# Patient Record
Sex: Male | Born: 1990 | Race: Black or African American | Hispanic: No | Marital: Single | Smoking: Current every day smoker
Health system: Southern US, Community
[De-identification: ages and names within clinical notes are randomized; demographics above are authoritative.]

---
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
## 2010-06-14 ENCOUNTER — Emergency Department (HOSPITAL_COMMUNITY)
Admission: EM | Admit: 2010-06-14 | Discharge: 2010-06-14 | Payer: Self-pay | Source: Home / Self Care | Admitting: Emergency Medicine

## 2010-06-15 ENCOUNTER — Emergency Department (HOSPITAL_COMMUNITY)
Admission: EM | Admit: 2010-06-15 | Discharge: 2010-06-15 | Payer: Self-pay | Source: Home / Self Care | Admitting: Emergency Medicine

## 2010-07-03 ENCOUNTER — Emergency Department (HOSPITAL_COMMUNITY)
Admission: EM | Admit: 2010-07-03 | Discharge: 2010-07-03 | Payer: Self-pay | Source: Home / Self Care | Admitting: Emergency Medicine

## 2010-07-03 LAB — GLUCOSE, CAPILLARY: Glucose-Capillary: 99 mg/dL (ref 70–99)

## 2010-09-08 LAB — CBC
HCT: 47.2 % (ref 39.0–52.0)
Hemoglobin: 16.6 g/dL (ref 13.0–17.0)
MCH: 31.5 pg (ref 26.0–34.0)
MCHC: 35.2 g/dL (ref 30.0–36.0)
MCV: 89.6 fL (ref 78.0–100.0)
Platelets: 266 10*3/uL (ref 150–400)
RBC: 5.27 MIL/uL (ref 4.22–5.81)
RDW: 13.8 % (ref 11.5–15.5)
WBC: 11.6 10*3/uL — ABNORMAL HIGH (ref 4.0–10.5)

## 2010-09-08 LAB — DIFFERENTIAL
Basophils Absolute: 0 10*3/uL (ref 0.0–0.1)
Basophils Relative: 0 % (ref 0–1)
Eosinophils Absolute: 0 10*3/uL (ref 0.0–0.7)
Eosinophils Relative: 0 % (ref 0–5)
Lymphocytes Relative: 14 % (ref 12–46)
Lymphs Abs: 1.6 10*3/uL (ref 0.7–4.0)
Monocytes Absolute: 0.9 10*3/uL (ref 0.1–1.0)
Monocytes Relative: 8 % (ref 3–12)
Neutro Abs: 9.1 10*3/uL — ABNORMAL HIGH (ref 1.7–7.7)
Neutrophils Relative %: 78 % — ABNORMAL HIGH (ref 43–77)

## 2010-09-08 LAB — COMPREHENSIVE METABOLIC PANEL
ALT: 25 U/L (ref 0–53)
AST: 37 U/L (ref 0–37)
Albumin: 4.5 g/dL (ref 3.5–5.2)
Alkaline Phosphatase: 86 U/L (ref 39–117)
BUN: 13 mg/dL (ref 6–23)
CO2: 24 mEq/L (ref 19–32)
Calcium: 9.7 mg/dL (ref 8.4–10.5)
Chloride: 100 mEq/L (ref 96–112)
Creatinine, Ser: 1.13 mg/dL (ref 0.4–1.5)
GFR calc Af Amer: >60 mL/min (ref >60)
GFR calc non Af Amer: >60 mL/min (ref >60)
Glucose, Bld: 98 mg/dL (ref 70–99)
Potassium: 3 mEq/L — ABNORMAL LOW (ref 3.5–5.1)
Sodium: 135 mEq/L (ref 135–145)
Total Bilirubin: 0.6 mg/dL (ref 0.3–1.2)
Total Protein: 8.1 g/dL (ref 6.0–8.3)

## 2010-09-08 LAB — URINALYSIS, ROUTINE W REFLEX MICROSCOPIC
Bilirubin Urine: NEGATIVE
Glucose, UA: NEGATIVE mg/dL
Ketones, ur: 40 mg/dL — AB
Nitrite: NEGATIVE
Protein, ur: NEGATIVE mg/dL
Specific Gravity, Urine: 1.017 (ref 1.005–1.030)
Urobilinogen, UA: 1 mg/dL (ref 0.0–1.0)
pH: 7 (ref 5.0–8.0)

## 2010-09-08 LAB — LIPASE, BLOOD: Lipase: 19 U/L (ref 11–59)

## 2010-09-08 LAB — URINE MICROSCOPIC-ADD ON

## 2010-09-22 LAB — DIFFERENTIAL
Basophils Absolute: 0 10*3/uL (ref 0.0–0.1)
Basophils Absolute: 0.1 10*3/uL (ref 0.0–0.1)
Basophils Relative: 0 % (ref 0–1)
Basophils Relative: 0 % (ref 0–1)
Eosinophils Absolute: 0 10*3/uL (ref 0.0–0.7)
Eosinophils Absolute: 0.1 10*3/uL (ref 0.0–0.7)
Eosinophils Relative: 0 % (ref 0–5)
Eosinophils Relative: 0 % (ref 0–5)
Lymphocytes Relative: 12 % (ref 12–46)
Lymphocytes Relative: 4 % — ABNORMAL LOW (ref 12–46)
Lymphs Abs: 0.7 10*3/uL (ref 0.7–4.0)
Lymphs Abs: 2.5 10*3/uL (ref 0.7–4.0)
Monocytes Absolute: 0.2 10*3/uL (ref 0.1–1.0)
Monocytes Absolute: 1.1 10*3/uL — ABNORMAL HIGH (ref 0.1–1.0)
Monocytes Relative: 1 % — ABNORMAL LOW (ref 3–12)
Monocytes Relative: 6 % (ref 3–12)
Neutro Abs: 16.3 10*3/uL — ABNORMAL HIGH (ref 1.7–7.7)
Neutro Abs: 16.8 10*3/uL — ABNORMAL HIGH (ref 1.7–7.7)
Neutrophils Relative %: 82 % — ABNORMAL HIGH (ref 43–77)
Neutrophils Relative %: 95 % — ABNORMAL HIGH (ref 43–77)

## 2010-09-22 LAB — CBC
HCT: 39.7 % (ref 39.0–52.0)
HCT: 41 % (ref 39.0–52.0)
Hemoglobin: 13.1 g/dL (ref 13.0–17.0)
Hemoglobin: 13.2 g/dL (ref 13.0–17.0)
MCHC: 32 g/dL (ref 30.0–36.0)
MCHC: 33.3 g/dL (ref 30.0–36.0)
MCV: 94 fL (ref 78.0–100.0)
MCV: 94.1 fL (ref 78.0–100.0)
Platelets: 252 10*3/uL (ref 150–400)
Platelets: 256 10*3/uL (ref 150–400)
RBC: 4.22 MIL/uL (ref 4.22–5.81)
RBC: 4.36 MIL/uL (ref 4.22–5.81)
RDW: 14.2 % (ref 11.5–15.5)
RDW: 14.3 % (ref 11.5–15.5)
WBC: 17.7 10*3/uL — ABNORMAL HIGH (ref 4.0–10.5)
WBC: 20 10*3/uL — ABNORMAL HIGH (ref 4.0–10.5)

## 2010-09-22 LAB — COMPREHENSIVE METABOLIC PANEL
ALT: 16 U/L (ref 0–53)
AST: 25 U/L (ref 0–37)
Albumin: 3.5 g/dL (ref 3.5–5.2)
Alkaline Phosphatase: 81 U/L (ref 39–117)
BUN: 10 mg/dL (ref 6–23)
CO2: 26 mEq/L (ref 19–32)
Calcium: 8.4 mg/dL (ref 8.4–10.5)
Chloride: 99 mEq/L (ref 96–112)
Creatinine, Ser: 0.98 mg/dL (ref 0.4–1.5)
GFR calc Af Amer: >60 mL/min (ref >60)
GFR calc non Af Amer: >60 mL/min (ref >60)
Glucose, Bld: 156 mg/dL — ABNORMAL HIGH (ref 70–99)
Potassium: 3 mEq/L — ABNORMAL LOW (ref 3.5–5.1)
Sodium: 136 mEq/L (ref 135–145)
Total Bilirubin: 0.2 mg/dL — ABNORMAL LOW (ref 0.3–1.2)
Total Protein: 6.8 g/dL (ref 6.0–8.3)

## 2010-09-22 LAB — LIPASE, BLOOD: Lipase: 34 U/L (ref 11–59)

## 2011-07-08 ENCOUNTER — Emergency Department (HOSPITAL_COMMUNITY)
Admission: EM | Admit: 2011-07-08 | Discharge: 2011-07-08 | Disposition: A | Discharge status: Home / Self Care | Payer: No Typology Code available for payment source | Attending: Emergency Medicine | Admitting: Emergency Medicine

## 2011-07-08 ENCOUNTER — Emergency Department (HOSPITAL_COMMUNITY): Payer: Self-pay

## 2011-07-08 ENCOUNTER — Encounter (HOSPITAL_COMMUNITY): Payer: Self-pay | Admitting: Emergency Medicine

## 2011-07-08 DIAGNOSIS — T1490XA Injury, unspecified, initial encounter: Secondary | ICD-10-CM | POA: Insufficient documentation

## 2011-07-08 DIAGNOSIS — M542 Cervicalgia: Secondary | ICD-10-CM | POA: Insufficient documentation

## 2011-07-08 DIAGNOSIS — M25519 Pain in unspecified shoulder: Secondary | ICD-10-CM | POA: Insufficient documentation

## 2011-07-08 DIAGNOSIS — F17200 Nicotine dependence, unspecified, uncomplicated: Secondary | ICD-10-CM | POA: Insufficient documentation

## 2011-07-08 DIAGNOSIS — M62838 Other muscle spasm: Secondary | ICD-10-CM

## 2011-07-08 MED ORDER — NAPROXEN 250 MG PO TABS: 250.0000 mg | ORAL_TABLET | Freq: Two times a day (BID) | ORAL | Status: DC

## 2011-07-08 MED ORDER — METHOCARBAMOL 500 MG PO TABS: 1000.0000 mg | ORAL_TABLET | Freq: Four times a day (QID) | ORAL | Status: AC | PRN

## 2011-07-08 MED ORDER — HYDROCODONE-ACETAMINOPHEN 5-325 MG PO TABS: ORAL_TABLET | ORAL | Status: AC
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
## 2011-08-17 ENCOUNTER — Emergency Department (HOSPITAL_COMMUNITY)
Admission: EM | Admit: 2011-08-17 | Discharge: 2011-08-18 | Disposition: A | Discharge status: Home / Self Care | Payer: Self-pay | Attending: Emergency Medicine | Admitting: Emergency Medicine

## 2011-08-17 ENCOUNTER — Encounter (HOSPITAL_COMMUNITY): Payer: Self-pay | Admitting: Emergency Medicine

## 2011-08-17 DIAGNOSIS — R197 Diarrhea, unspecified: Secondary | ICD-10-CM | POA: Insufficient documentation

## 2011-08-17 DIAGNOSIS — R112 Nausea with vomiting, unspecified: Secondary | ICD-10-CM | POA: Insufficient documentation

## 2011-08-17 MED ORDER — SODIUM CHLORIDE 0.9 % IV BOLUS (SEPSIS)
1000.0000 mL | Freq: Once | INTRAVENOUS | Status: AC
  Administered 2011-08-18: 1000 mL via INTRAVENOUS

## 2011-08-17 NOTE — ED Notes (Signed)
Pt alert, nad, arrives via EMS, ambulatory, c/o nausea and diarrhea, onset today, states recently experiencing a URI, resp even unlabored, skin pwd, MMM

## 2011-08-18 LAB — POCT I-STAT, CHEM 8
Calcium, Ion: 1.14 mmol/L (ref 1.12–1.32)
Creatinine, Ser: 0.9 mg/dL (ref 0.50–1.35)
Glucose, Bld: 89 mg/dL (ref 70–99)
Hemoglobin: 13.9 g/dL (ref 13.0–17.0)
Sodium: 141 mEq/L (ref 135–145)
TCO2: 27 mmol/L (ref 0–100)

## 2011-08-18 MED ORDER — LOPERAMIDE HCL 2 MG PO CAPS: 2.0000 mg | ORAL_CAPSULE | Freq: Four times a day (QID) | ORAL | Status: AC | PRN

## 2011-08-18 MED ORDER — ONDANSETRON HCL 4 MG/2ML IJ SOLN
4.0000 mg | Freq: Once | INTRAMUSCULAR | Status: DC
  Filled 2011-08-18: qty 2

## 2011-08-18 MED ORDER — PROMETHAZINE HCL 25 MG PO TABS: 25.0000 mg | ORAL_TABLET | Freq: Four times a day (QID) | ORAL | Status: DC | PRN

## 2011-08-18 MED ORDER — SODIUM CHLORIDE 0.9 % IV BOLUS (SEPSIS)
1000.0000 mL | Freq: Once | INTRAVENOUS | Status: AC
  Administered 2011-08-18: 1000 mL via INTRAVENOUS

## 2011-08-18 NOTE — Discharge Instructions (Signed)
Clear Liquid Diet The clear liquid dietconsists of foods that are liquid or will become liquid at room temperature.You should be able to see through the liquid and beverages. Examples of foods allowed on a clear liquid diet include fruit juice, broth or bouillon, gelatin, or frozen ice pops. The purpose of this diet is to provide necessary fluid, electrolytes such a sodium and potassium, and energy to keep the body functioning during times when you are not able to consume a regular diet.A clear liquid diet should not be continued for long periods of time as it is not nutritionally adequate.  REASONS FOR USING A CLEAR LIQUID DIET  In sudden onset (acute) conditions for a patient before or after surgery.   As the first step in oral feeding.   For fluid and electrolyte replacement in diarrheal diseases.   As a diet before certain medical tests are performed.  ADEQUACY The clear liquid diet is adequate only in ascorbic acid, according to the Recommended Dietary Allowances of the National Research Council. CHOOSING FOODS Breads and Starches  Allowed:  None are allowed.   Avoid: All are avoided.  Vegetables  Allowed:  Strained tomato or vegetable juice.   Avoid: Any others.  Fruit  Allowed:  Strained fruit juices and fruit drinks. Include 1 serving of citrus or vitamin C-enriched fruit juice daily.   Avoid: Any others.  Meat and Meat Substitutes  Allowed:  None are allowed.   Avoid: All are avoided.  Milk  Allowed:  None are allowed.   Avoid: All are avoided.  Soups and Combination Foods  Allowed:  Clear bouillon, broth, or strained broth-based soups.   Avoid: Any others.  Desserts and Sweets  Allowed:  Sugar, honey. High protein gelatin. Flavored gelatin, ices, or frozen ice pops that do not contain milk.   Avoid: Any others.  Fats and Oils  Allowed:  None are allowed.   Avoid: All are avoided.  Beverages  Allowed:  Carbonated beverages, cereal beverages, coffee  (regular or decaffeinated), or tea.   Avoid: Any others.  Condiments  Allowed:  Iodized salt.   Avoid: Any others, including pepper.  Supplements  Allowed:  Liquid nutrition beverages.   Avoid: Any others that contain lactose or fiber.  SAMPLE MEAL PLAN Breakfast  4 oz strained orange juice.    to 1 cup gelatin (plain or fortified).   1 cup beverage (coffee or tea).   Sugar, if desired.  Midmorning Snack   cup gelatin (plain or fortified).  Lunch  1 cup broth or consomm.   4 oz strained grapefruit juice.    cup gelatin (plain or fortified).   1 cup beverage (coffee or tea).   Sugar, if desired.  Midafternoon Snack   cup fruit ice.    cup strained fruit juice.  Dinner  1 cup broth or consomm.    cup cranberry juice.    cup flavored gelatin (plain or fortified).   1 cup beverage (coffee or tea).   Sugar, if desired.  Evening Snack  4 oz strained apple juice (vitamin C-fortified).    cup flavored gelatin (plain or fortified).  Document Released: 06/15/2005 Document Revised: 02/25/2011 Document Reviewed: 09/12/2010 [employer] Patient Information 2012 [employer]. 

## 2011-08-18 NOTE — ED Provider Notes (Signed)
History     CSN: [id number]  Arrival date & time 08/17/11  2254   First MD Initiated Contact with Patient 08/17/11 2345      Chief Complaint  Patient presents with  . Nausea  . Diarrhea    (Consider location/radiation/quality/duration/timing/severity/associated sxs/prior treatment) Patient is a 21 y.o. Bryant presenting with diarrhea. The history is provided by the patient.  Diarrhea The primary symptoms include nausea, vomiting and diarrhea. Primary symptoms do not include fever, abdominal pain, dysuria or rash. The illness began today. The onset was gradual.  The illness does not include chills, constipation, tenesmus or back pain. Associated medical issues do not include inflammatory bowel disease or gallstones. Risk factors: No diabetes, recent antibiotics or recent travel.   Arrives by EMS. Patient having uncontrollable diarrhea and nausea vomiting started today. No known sick contacts. He has had recent URI symptoms with cough and body aches that has resolved. He denies any fevers or chills. Some intermittent abdominal cramping but no abdominal pain otherwise. No pain at this time. He has not tried any medications and is unable hold anything down. Moderate in severity. No known aggravating or alleviating factors. No chest pain or shortness of breath. No blood in stools or emesis. No bilious emesis. No coffee-ground emesis. No watery or mucus in stools  Past Medical History  Diagnosis Date  . Asthma     History reviewed. No pertinent past surgical history.  No family history on file.  History  Substance Use Topics  . Smoking status: Current Everyday Smoker -- 1.0 packs/day    Types: Cigarettes  . Smokeless tobacco: Not on file  . Alcohol Use: Yes     occasionally      Review of Systems  Constitutional: Negative for fever and chills.  HENT: Negative for neck pain and neck stiffness.   Eyes: Negative for pain.  Respiratory: Negative for shortness of breath.     Cardiovascular: Negative for chest pain.  Gastrointestinal: Positive for nausea, vomiting and diarrhea. Negative for abdominal pain, constipation and anal bleeding.  Genitourinary: Negative for dysuria.  Musculoskeletal: Negative for back pain.  Skin: Negative for rash.  Neurological: Negative for headaches.  All other systems reviewed and are negative.    Allergies  Review of patient's allergies indicates no known allergies.  Home Medications  No current outpatient prescriptions on file.  BP 100/Robert  Pulse 65  Temp 98.3 F (36.8 C)  Resp 24  Wt 125 lb (56.7 kg)  SpO2 100%  Physical Exam  Constitutional: He is oriented to person, place, and time. He appears well-developed and well-nourished.  HENT:  Head: Normocephalic and atraumatic.       Dry mucous memory  Eyes: Conjunctivae and EOM are normal. Pupils are equal, round, and reactive to light.  Neck: Trachea normal. Neck supple. No thyromegaly present.  Cardiovascular: Normal rate, regular rhythm, S1 normal, S2 normal and normal pulses.     No systolic murmur is present   No diastolic murmur is present  Pulses:      Radial pulses are 2+ on the right side, and 2+ on the left side.  Pulmonary/Chest: Effort normal and breath sounds normal. He has no wheezes. He has no rhonchi. He has no rales. He exhibits no tenderness.  Abdominal: Soft. Normal appearance. He exhibits no mass. There is no tenderness. There is no rebound, no guarding, no CVA tenderness and negative Murphy's sign.       Hyperactive bowel sounds. No peritonitis  Musculoskeletal:  BLE:s Calves nontender, no cords or erythema, negative Homans sign  Neurological: He is alert and oriented to person, place, and time. He has normal strength. No cranial nerve deficit or sensory deficit. GCS eye subscore is 4. GCS verbal subscore is 5. GCS motor subscore is 6.  Skin: Skin is warm and dry. No rash noted. He is not diaphoretic.  Psychiatric: His speech is normal.        Cooperative and appropriate    ED Course  Procedures (including critical care time)  Results for orders placed during the hospital encounter of 08/17/11  POCT I-STAT, CHEM 8      Component Value Range   Sodium 141  135 - 145 (mEq/L)   Potassium 4.0  3.5 - 5.1 (mEq/L)   Chloride 103  96 - 112 (mEq/L)   BUN 10  6 - 23 (mg/dL)   Creatinine, Ser 7.82  0.Robert - 1.35 (mg/dL)   Glucose, Bld 89  70 - 99 (mg/dL)   Calcium, Ion 9.56  2.13 - 1.32 (mmol/L)   TCO2 27  0 - 100 (mmol/L)   Hemoglobin 13.9  13.0 - 17.0 (g/dL)   HCT 08.6  57.8 - 46.9 (%)   IV fluids and Zofran  Serial evaluations no peritonitis.  Recheck at 3 AM. Patient feeling much better and requesting to be discharged home. No diarrhea or vomiting in the ED    MDM   Nausea vomiting and diarrhea with recent URI. Likely viral infection. No indication for antibiotics at this time. Reliable historian agrees to dehydration precautions and abdominal pain precautions. Prescription for Phenergan provide        Sunnie Nielsen, MD 08/18/11 [phone]

## 2011-12-31 ENCOUNTER — Emergency Department (HOSPITAL_COMMUNITY)
Admission: EM | Admit: 2011-12-31 | Discharge: 2011-12-31 | Disposition: A | Discharge status: Home / Self Care | Payer: Self-pay | Attending: Emergency Medicine | Admitting: Emergency Medicine

## 2011-12-31 ENCOUNTER — Encounter (HOSPITAL_COMMUNITY): Payer: Self-pay | Admitting: *Deleted

## 2011-12-31 DIAGNOSIS — R112 Nausea with vomiting, unspecified: Secondary | ICD-10-CM

## 2011-12-31 DIAGNOSIS — R197 Diarrhea, unspecified: Secondary | ICD-10-CM | POA: Insufficient documentation

## 2011-12-31 DIAGNOSIS — R1110 Vomiting, unspecified: Secondary | ICD-10-CM | POA: Insufficient documentation

## 2011-12-31 DIAGNOSIS — R109 Unspecified abdominal pain: Secondary | ICD-10-CM | POA: Insufficient documentation

## 2011-12-31 DIAGNOSIS — J45909 Unspecified asthma, uncomplicated: Secondary | ICD-10-CM | POA: Insufficient documentation

## 2011-12-31 DIAGNOSIS — F17200 Nicotine dependence, unspecified, uncomplicated: Secondary | ICD-10-CM | POA: Insufficient documentation

## 2011-12-31 MED ORDER — ONDANSETRON HCL 4 MG/2ML IJ SOLN
4.0000 mg | Freq: Once | INTRAMUSCULAR | Status: AC
  Administered 2011-12-31: 4 mg via INTRAVENOUS
  Filled 2011-12-31: qty 2

## 2011-12-31 MED ORDER — SODIUM CHLORIDE 0.9 % IV BOLUS (SEPSIS)
1000.0000 mL | Freq: Once | INTRAVENOUS | Status: AC
  Administered 2011-12-31: 1000 mL via INTRAVENOUS

## 2011-12-31 MED ORDER — LORAZEPAM 2 MG/ML IJ SOLN
0.5000 mg | Freq: Once | INTRAMUSCULAR | Status: AC
  Administered 2011-12-31: 0.5 mg via INTRAVENOUS
  Filled 2011-12-31: qty 1

## 2011-12-31 MED ORDER — ONDANSETRON HCL 8 MG PO TABS: 8.0000 mg | ORAL_TABLET | Freq: Three times a day (TID) | ORAL | Status: AC | PRN

## 2011-12-31 MED ORDER — HYDROMORPHONE HCL PF 1 MG/ML IJ SOLN
0.5000 mg | Freq: Once | INTRAMUSCULAR | Status: AC
Start: 2011-12-31 — End: 2011-12-31
  Administered 2011-12-31: 0.5 mg via INTRAVENOUS
  Filled 2011-12-31: qty 1

## 2011-12-31 MED ORDER — PANTOPRAZOLE SODIUM 40 MG IV SOLR
40.0000 mg | Freq: Once | INTRAVENOUS | Status: AC
  Administered 2011-12-31: 40 mg via INTRAVENOUS
  Filled 2011-12-31: qty 40

## 2011-12-31 NOTE — ED Notes (Signed)
Pt states "I been vomiting and having boo boo since this morning, I think I got the virus again, I just had the virus"; pt actively vomiting in Triage

## 2011-12-31 NOTE — ED Notes (Signed)
Pulse Ox in place. Sats 100%

## 2011-12-31 NOTE — ED Provider Notes (Signed)
History     CSN: [id number]  Arrival date & time 12/31/11  6295   First MD Initiated Contact with Patient 12/31/11 [phone]      Chief Complaint  Patient presents with  . Emesis  . Diarrhea  . Abdominal Pain    (Consider location/radiation/quality/duration/timing/severity/associated sxs/prior treatment) Patient is a 21 y.o. male presenting with vomiting, diarrhea, and abdominal pain. The history is provided by the patient.  Emesis  Associated symptoms include abdominal pain and diarrhea. Pertinent negatives include no chills, no cough, no fever and no headaches.  Diarrhea The primary symptoms include abdominal pain, vomiting and diarrhea. Primary symptoms do not include fever, dysuria or rash.  The illness does not include chills or back pain.  Abdominal Pain The primary symptoms of the illness include abdominal pain, vomiting and diarrhea. The primary symptoms of the illness do not include fever, shortness of breath or dysuria.  Symptoms associated with the illness do not include chills or back pain.  pt c/o nvd onset this morning. Emesis episodic, several episodes, clear. No bloody or bilious emesis. Diarrhea watery, not bloody. Diffuse abd crampy pain comes and goes, no constant/focal pain. No known ill contacts or bad food ingestion. No gu c/o. No fever or chills. Denies cough, sob, cp or uri c/o. No back or flank pain. No faintness or dizziness.     Past Medical History  Diagnosis Date  . Asthma     History reviewed. No pertinent past surgical history.  No family history on file.  History  Substance Use Topics  . Smoking status: Current Everyday Smoker -- 1.0 packs/day    Types: Cigarettes  . Smokeless tobacco: Not on file  . Alcohol Use: Yes     occasionally      Review of Systems  Constitutional: Negative for fever and chills.  HENT: Negative for sore throat and neck pain.   Eyes: Negative for redness.  Respiratory: Negative for cough and shortness of breath.    Cardiovascular: Negative for chest pain.  Gastrointestinal: Positive for vomiting, abdominal pain and diarrhea.  Genitourinary: Negative for dysuria and flank pain.  Musculoskeletal: Negative for back pain.  Skin: Negative for rash.  Neurological: Negative for headaches.  Hematological: Does not bruise/bleed easily.  Psychiatric/Behavioral: Negative for confusion.    Allergies  Review of patient's allergies indicates no known allergies.  Home Medications  No current outpatient prescriptions on file.  BP 144/94  Pulse 51  Temp 97.7 F (36.5 C) (Oral)  Resp 18  Wt 122 lb (55.339 kg)  SpO2 100%  Physical Exam  Nursing note and vitals reviewed. Constitutional: He is oriented to person, place, and time. He appears well-developed and well-nourished. No distress.  HENT:  Head: Atraumatic.  Nose: Nose normal.  Mouth/Throat: Oropharynx is clear and moist.  Eyes: Pupils are equal, round, and reactive to light.  Neck: Neck supple. No tracheal deviation present.       No stiffness or rigidity  Cardiovascular: Normal rate, regular rhythm, normal heart sounds and intact distal pulses.  Exam reveals no friction rub.   No murmur heard. Pulmonary/Chest: Effort normal and breath sounds normal. No accessory muscle usage. No respiratory distress.  Abdominal: Soft. Bowel sounds are normal. He exhibits no distension and no mass. There is no tenderness. There is no rebound and no guarding.  Musculoskeletal: Normal range of motion. He exhibits no edema.  Neurological: He is alert and oriented to person, place, and time.  Skin: Skin is warm and dry.  Psychiatric:  anxious    ED Course  Procedures (including critical care time)    MDM  Iv ns bolus. zofran iv. Ativan .5 mg iv.    Recheck feels improved. abd soft nt. Additional ns iv.   Tolerating po fluids. No recurrent nvd.   abd soft nt.   Recheck tomorrow morning if symptoms fail to improve/resolve. Instructed to return  right away if worse, severe abdominal pain, persistent vomiting, fevers.      Suzi Roots, MD 12/31/11 1322

## 2013-07-17 ENCOUNTER — Encounter (HOSPITAL_COMMUNITY): Payer: Self-pay | Admitting: Emergency Medicine

## 2013-07-17 ENCOUNTER — Emergency Department (HOSPITAL_COMMUNITY)
Admission: EM | Admit: 2013-07-17 | Discharge: 2013-07-17 | Discharge status: Against Medical Advice | Payer: Self-pay | Attending: Emergency Medicine | Admitting: Emergency Medicine

## 2013-07-17 DIAGNOSIS — R6883 Chills (without fever): Secondary | ICD-10-CM | POA: Insufficient documentation

## 2013-07-17 DIAGNOSIS — F17200 Nicotine dependence, unspecified, uncomplicated: Secondary | ICD-10-CM | POA: Insufficient documentation

## 2013-07-17 DIAGNOSIS — R1084 Generalized abdominal pain: Secondary | ICD-10-CM | POA: Insufficient documentation

## 2013-07-17 DIAGNOSIS — R197 Diarrhea, unspecified: Secondary | ICD-10-CM | POA: Insufficient documentation

## 2013-07-17 DIAGNOSIS — J45909 Unspecified asthma, uncomplicated: Secondary | ICD-10-CM | POA: Insufficient documentation

## 2013-07-17 DIAGNOSIS — IMO0001 Reserved for inherently not codable concepts without codable children: Secondary | ICD-10-CM | POA: Insufficient documentation

## 2013-07-17 DIAGNOSIS — R112 Nausea with vomiting, unspecified: Secondary | ICD-10-CM | POA: Insufficient documentation

## 2013-07-17 LAB — URINALYSIS, ROUTINE W REFLEX MICROSCOPIC
Bilirubin Urine: NEGATIVE
GLUCOSE, UA: NEGATIVE mg/dL
Hgb urine dipstick: NEGATIVE
KETONES UR: 15 mg/dL — AB
LEUKOCYTES UA: NEGATIVE
Nitrite: NEGATIVE
PROTEIN: NEGATIVE mg/dL
Specific Gravity, Urine: 1.023 (ref 1.005–1.030)
UROBILINOGEN UA: 0.2 mg/dL (ref 0.0–1.0)
pH: 7 (ref 5.0–8.0)

## 2013-07-17 LAB — COMPREHENSIVE METABOLIC PANEL
ALT: 24 U/L (ref 0–53)
AST: 30 U/L (ref 0–37)
Albumin: 4.1 g/dL (ref 3.5–5.2)
Alkaline Phosphatase: 83 U/L (ref 39–117)
BUN: 10 mg/dL (ref 6–23)
CHLORIDE: 102 meq/L (ref 96–112)
CO2: 25 meq/L (ref 19–32)
CREATININE: 0.88 mg/dL (ref 0.50–1.35)
Calcium: 9.4 mg/dL (ref 8.4–10.5)
GLUCOSE: 160 mg/dL — AB (ref 70–99)
Potassium: 4.1 mEq/L (ref 3.7–5.3)
Sodium: 142 mEq/L (ref 137–147)
Total Protein: 7.9 g/dL (ref 6.0–8.3)

## 2013-07-17 LAB — CBC WITH DIFFERENTIAL/PLATELET
BASOS ABS: 0 10*3/uL (ref 0.0–0.1)
Basophils Relative: 0 % (ref 0–1)
EOS PCT: 0 % (ref 0–5)
Eosinophils Absolute: 0 10*3/uL (ref 0.0–0.7)
HEMATOCRIT: 40.7 % (ref 39.0–52.0)
HEMOGLOBIN: 13.9 g/dL (ref 13.0–17.0)
LYMPHS ABS: 3 10*3/uL (ref 0.7–4.0)
LYMPHS PCT: 18 % (ref 12–46)
MCH: 31.6 pg (ref 26.0–34.0)
MCHC: 34.2 g/dL (ref 30.0–36.0)
MCV: 92.5 fL (ref 78.0–100.0)
MONO ABS: 0.8 10*3/uL (ref 0.1–1.0)
MONOS PCT: 5 % (ref 3–12)
NEUTROS ABS: 13.1 10*3/uL — AB (ref 1.7–7.7)
Neutrophils Relative %: 77 % (ref 43–77)
Platelets: 280 10*3/uL (ref 150–400)
RBC: 4.4 MIL/uL (ref 4.22–5.81)
RDW: 14 % (ref 11.5–15.5)
WBC: 16.9 10*3/uL — AB (ref 4.0–10.5)

## 2013-07-17 LAB — GLUCOSE, CAPILLARY: Glucose-Capillary: 140 mg/dL — ABNORMAL HIGH (ref 70–99)

## 2013-07-17 LAB — LIPASE, BLOOD: Lipase: 27 U/L (ref 11–59)

## 2013-07-17 MED ORDER — ONDANSETRON 4 MG PO TBDP
8.0000 mg | ORAL_TABLET | Freq: Once | ORAL | Status: AC
  Administered 2013-07-17: 8 mg via ORAL
  Filled 2013-07-17: qty 2

## 2013-07-17 NOTE — ED Notes (Signed)
Pt trying to leave, encouraged to stay, wait, plan and process explained with rationale.

## 2013-07-17 NOTE — ED Notes (Signed)
Pt. reports nausea, vomitting and diarrhea with generalized abdominal pain / chills/ body aches onset this evening .

## 2013-07-17 NOTE — ED Notes (Signed)
No answer, unable to find pt or visitor.

## 2013-07-17 NOTE — ED Notes (Signed)
Unable to locate pt for exam room 

## 2013-07-17 NOTE — ED Notes (Signed)
Unable to locate pt or visitor, no answer, not in w/r, h/w, triage, entry way or b/r.

## 2013-07-17 NOTE — ED Notes (Signed)
Dry heaving and spitting zofran out

## 2013-07-18 ENCOUNTER — Encounter (HOSPITAL_COMMUNITY): Payer: Self-pay | Admitting: Emergency Medicine

## 2013-07-18 ENCOUNTER — Emergency Department (HOSPITAL_COMMUNITY): Payer: Self-pay

## 2013-07-18 ENCOUNTER — Emergency Department (HOSPITAL_COMMUNITY)
Admission: EM | Admit: 2013-07-18 | Discharge: 2013-07-18 | Disposition: A | Discharge status: Home / Self Care | Payer: Self-pay | Attending: Emergency Medicine | Admitting: Emergency Medicine

## 2013-07-18 DIAGNOSIS — K529 Noninfective gastroenteritis and colitis, unspecified: Secondary | ICD-10-CM

## 2013-07-18 DIAGNOSIS — K5289 Other specified noninfective gastroenteritis and colitis: Secondary | ICD-10-CM | POA: Insufficient documentation

## 2013-07-18 DIAGNOSIS — J45909 Unspecified asthma, uncomplicated: Secondary | ICD-10-CM | POA: Insufficient documentation

## 2013-07-18 DIAGNOSIS — F17200 Nicotine dependence, unspecified, uncomplicated: Secondary | ICD-10-CM | POA: Insufficient documentation

## 2013-07-18 LAB — CBC WITH DIFFERENTIAL/PLATELET
BASOS PCT: 0 % (ref 0–1)
Basophils Absolute: 0 10*3/uL (ref 0.0–0.1)
EOS ABS: 0 10*3/uL (ref 0.0–0.7)
Eosinophils Relative: 0 % (ref 0–5)
HEMATOCRIT: 39.6 % (ref 39.0–52.0)
HEMOGLOBIN: 13.5 g/dL (ref 13.0–17.0)
Lymphocytes Relative: 8 % — ABNORMAL LOW (ref 12–46)
Lymphs Abs: 1.4 10*3/uL (ref 0.7–4.0)
MCH: 31.3 pg (ref 26.0–34.0)
MCHC: 34.1 g/dL (ref 30.0–36.0)
MCV: 91.7 fL (ref 78.0–100.0)
MONO ABS: 0.4 10*3/uL (ref 0.1–1.0)
MONOS PCT: 2 % — AB (ref 3–12)
NEUTROS PCT: 90 % — AB (ref 43–77)
Neutro Abs: 16 10*3/uL — ABNORMAL HIGH (ref 1.7–7.7)
Platelets: 249 10*3/uL (ref 150–400)
RBC: 4.32 MIL/uL (ref 4.22–5.81)
RDW: 13.8 % (ref 11.5–15.5)
WBC: 17.8 10*3/uL — ABNORMAL HIGH (ref 4.0–10.5)

## 2013-07-18 LAB — RAPID URINE DRUG SCREEN, HOSP PERFORMED
Amphetamines: NOT DETECTED
BENZODIAZEPINES: NOT DETECTED
Barbiturates: NOT DETECTED
COCAINE: NOT DETECTED
Opiates: NOT DETECTED
TETRAHYDROCANNABINOL: POSITIVE — AB

## 2013-07-18 LAB — COMPREHENSIVE METABOLIC PANEL
ALBUMIN: 4.1 g/dL (ref 3.5–5.2)
ALT: 26 U/L (ref 0–53)
AST: 30 U/L (ref 0–37)
Alkaline Phosphatase: 84 U/L (ref 39–117)
BUN: 9 mg/dL (ref 6–23)
CO2: 19 mEq/L (ref 19–32)
CREATININE: 0.78 mg/dL (ref 0.50–1.35)
Calcium: 9.2 mg/dL (ref 8.4–10.5)
Chloride: 98 mEq/L (ref 96–112)
GFR calc Af Amer: >90 mL/min (ref >90)
GFR calc non Af Amer: >90 mL/min (ref >90)
Glucose, Bld: 167 mg/dL — ABNORMAL HIGH (ref 70–99)
Potassium: 3.8 mEq/L (ref 3.7–5.3)
Sodium: 137 mEq/L (ref 137–147)
TOTAL PROTEIN: 7.6 g/dL (ref 6.0–8.3)
Total Bilirubin: 0.4 mg/dL (ref 0.3–1.2)

## 2013-07-18 LAB — URINALYSIS, ROUTINE W REFLEX MICROSCOPIC
Bilirubin Urine: NEGATIVE
Glucose, UA: 100 mg/dL — AB
Hgb urine dipstick: NEGATIVE
KETONES UR: 15 mg/dL — AB
LEUKOCYTES UA: NEGATIVE
NITRITE: NEGATIVE
PH: 8.5 — AB (ref 5.0–8.0)
Protein, ur: 30 mg/dL — AB
SPECIFIC GRAVITY, URINE: 1.025 (ref 1.005–1.030)
Urobilinogen, UA: 0.2 mg/dL (ref 0.0–1.0)

## 2013-07-18 LAB — URINE MICROSCOPIC-ADD ON

## 2013-07-18 LAB — LIPASE, BLOOD: LIPASE: 15 U/L (ref 11–59)

## 2013-07-18 LAB — MAGNESIUM: Magnesium: 1.4 mg/dL — ABNORMAL LOW (ref 1.5–2.5)

## 2013-07-18 MED ORDER — HYDROCODONE-ACETAMINOPHEN 5-325 MG PO TABS: 1.0000 | ORAL_TABLET | Freq: Four times a day (QID) | ORAL | Status: DC | PRN

## 2013-07-18 MED ORDER — IOHEXOL 300 MG/ML  SOLN
100.0000 mL | Freq: Once | INTRAMUSCULAR | Status: AC | PRN
  Administered 2013-07-18: 100 mL via INTRAVENOUS

## 2013-07-18 MED ORDER — HYDROCODONE-ACETAMINOPHEN 5-325 MG PO TABS
2.0000 | ORAL_TABLET | Freq: Once | ORAL | Status: AC
  Administered 2013-07-18: 2 via ORAL
  Filled 2013-07-18: qty 2

## 2013-07-18 MED ORDER — MORPHINE SULFATE 4 MG/ML IJ SOLN
4.0000 mg | Freq: Once | INTRAMUSCULAR | Status: AC
  Administered 2013-07-18: 4 mg via INTRAVENOUS
  Filled 2013-07-18: qty 1

## 2013-07-18 MED ORDER — SODIUM CHLORIDE 0.9 % IV BOLUS (SEPSIS)
1000.0000 mL | Freq: Once | INTRAVENOUS | Status: AC
  Administered 2013-07-18: 1000 mL via INTRAVENOUS

## 2013-07-18 MED ORDER — PROMETHAZINE HCL 25 MG RE SUPP: 25.0000 mg | Freq: Four times a day (QID) | RECTAL | Status: DC | PRN

## 2013-07-18 MED ORDER — METOCLOPRAMIDE HCL 5 MG/ML IJ SOLN
10.0000 mg | Freq: Once | INTRAMUSCULAR | Status: AC
  Administered 2013-07-18: 10 mg via INTRAVENOUS
  Filled 2013-07-18: qty 2

## 2013-07-18 MED ORDER — PROMETHAZINE HCL 25 MG PO TABS: 25.0000 mg | ORAL_TABLET | Freq: Four times a day (QID) | ORAL | Status: DC | PRN

## 2013-07-18 MED ORDER — ONDANSETRON 8 MG PO TBDP
8.0000 mg | ORAL_TABLET | Freq: Once | ORAL | Status: AC
  Administered 2013-07-18: 8 mg via ORAL
  Filled 2013-07-18: qty 1

## 2013-07-18 MED ORDER — IBUPROFEN 400 MG PO TABS: 400.0000 mg | ORAL_TABLET | Freq: Four times a day (QID) | ORAL | Status: DC | PRN

## 2013-07-18 MED ORDER — IOHEXOL 300 MG/ML  SOLN
50.0000 mL | Freq: Once | INTRAMUSCULAR | Status: AC | PRN
  Administered 2013-07-18: 50 mL via ORAL

## 2013-07-18 NOTE — ED Notes (Signed)
Brought in by EMS from home with c/o abdominal pain with nausea and vomiting.  Per EMS, pt reported that he has been having abdominal pain and emesis since yesterday afternoon at around 1600.

## 2013-07-18 NOTE — Discharge Instructions (Signed)
We saw you in the ER for the abdominal pain. All of our results are normal, including all labs and imaging. Kidney function is fine as well. We are not sure what is causing your abdominal pain, but it seems to be a mild infection. We recommend that you see your primary care doctor within 2-3 days for further evaluation. If your symptoms get worse, return to the ER. Take the pain meds and nausea meds as prescribed.   Viral Gastroenteritis Viral gastroenteritis is also known as stomach flu. This condition affects the stomach and intestinal tract. It can cause sudden diarrhea and vomiting. The illness typically lasts 3 to 8 days. Most people develop an immune response that eventually gets rid of the virus. While this natural response develops, the virus can make you quite ill. CAUSES  Many different viruses can cause gastroenteritis, such as rotavirus or noroviruses. You can catch one of these viruses by consuming contaminated food or water. You may also catch a virus by sharing utensils or other personal items with an infected person or by touching a contaminated surface. SYMPTOMS  The most common symptoms are diarrhea and vomiting. These problems can cause a severe loss of body fluids (dehydration) and a body salt (electrolyte) imbalance. Other symptoms may include:  Fever.  Headache.  Fatigue.  Abdominal pain. DIAGNOSIS  Your caregiver can usually diagnose viral gastroenteritis based on your symptoms and a physical exam. A stool sample may also be taken to test for the presence of viruses or other infections. TREATMENT  This illness typically goes away on its own. Treatments are aimed at rehydration. The most serious cases of viral gastroenteritis involve vomiting so severely that you are not able to keep fluids down. In these cases, fluids must be given through an intravenous line (IV). HOME CARE INSTRUCTIONS   Drink enough fluids to keep your urine clear or pale yellow. Drink small  amounts of fluids frequently and increase the amounts as tolerated.  Ask your caregiver for specific rehydration instructions.  Avoid:  Foods high in sugar.  Alcohol.  Carbonated drinks.  Tobacco.  Juice.  Caffeine drinks.  Extremely hot or cold fluids.  Fatty, greasy foods.  Too much intake of anything at one time.  Dairy products until 24 to 48 hours after diarrhea stops.  You may consume probiotics. Probiotics are active cultures of beneficial bacteria. They may lessen the amount and number of diarrheal stools in adults. Probiotics can be found in yogurt with active cultures and in supplements.  Wash your hands well to avoid spreading the virus.  Only take over-the-counter or prescription medicines for pain, discomfort, or fever as directed by your caregiver. Do not give aspirin to children. Antidiarrheal medicines are not recommended.  Ask your caregiver if you should continue to take your regular prescribed and over-the-counter medicines.  Keep all follow-up appointments as directed by your caregiver. SEEK IMMEDIATE MEDICAL CARE IF:   You are unable to keep fluids down.  You do not urinate at least once every 6 to 8 hours.  You develop shortness of breath.  You notice blood in your stool or vomit. This may look like coffee grounds.  You have abdominal pain that increases or is concentrated in one small area (localized).  You have persistent vomiting or diarrhea.  You have a fever.  The patient is a child younger than 3 months, and he or she has a fever.  The patient is a child older than 3 months, and he or she  has a fever and persistent symptoms.  The patient is a child older than 3 months, and he or she has a fever and symptoms suddenly get worse.  The patient is a baby, and he or she has no tears when crying. MAKE SURE YOU:   Understand these instructions.  Will watch your condition.  Will get help right away if you are not doing well or get  worse. Document Released: 06/15/2005 Document Revised: 09/07/2011 Document Reviewed: 04/01/2011 [facility] Patient Information 2014 [place], [place].

## 2013-07-18 NOTE — ED Provider Notes (Addendum)
CSN: [id number]     Arrival date & time 07/18/13  0118 History   First MD Initiated Contact with Patient 07/18/13 0131     Chief Complaint  Patient presents with  . Abdominal Pain  . Emesis   (Consider location/radiation/quality/duration/timing/severity/associated sxs/prior Treatment) HPI Comments: Pt comes in with cc of abd pain since y'day afternoon. Pain is periumbilical, constant, severe, non radiating. There is nausea with emesis - 5-10 episodes of non bloody, non bilious episodes today, and 3-4 loose nno bloody BM as well. He has no hx of abd surgery, similar pain and no uti like sx, or trauma. Pt uses marijuana, no illicit drug use otherwise.  Pt noted pacing in the room when i walked in, and states he feels anxious.  Patient is a 23 y.o. male presenting with abdominal pain and vomiting. The history is provided by the patient.  Abdominal Pain Associated symptoms: diarrhea, nausea and vomiting   Associated symptoms: no chest pain, no cough, no dysuria and no shortness of breath   Emesis Associated symptoms: abdominal pain and diarrhea     Past Medical History  Diagnosis Date  . Asthma    History reviewed. No pertinent past surgical history. History reviewed. No pertinent family history. History  Substance Use Topics  . Smoking status: Current Every Day Smoker -- 1.00 packs/day    Types: Cigarettes  . Smokeless tobacco: Not on file  . Alcohol Use: Yes     Comment: occasionally    Review of Systems  Constitutional: Negative for activity change and appetite change.  Respiratory: Negative for cough and shortness of breath.   Cardiovascular: Negative for chest pain.  Gastrointestinal: Positive for nausea, vomiting, abdominal pain and diarrhea. Negative for blood in stool.  Genitourinary: Negative for dysuria.  Neurological: Negative for dizziness.    Allergies  Review of patient's allergies indicates no known allergies.  Home Medications  No current outpatient  prescriptions on file. BP 146/75  Pulse 68  Temp(Src) 98.7 F (37.1 C) (Oral)  Resp 18  SpO2 99% Physical Exam  Nursing note and vitals reviewed. Constitutional: He is oriented to person, place, and time. He appears well-developed.  HENT:  Head: Normocephalic and atraumatic.  Eyes: Conjunctivae and EOM are normal. Pupils are equal, round, and reactive to light.  Neck: Normal range of motion. Neck supple.  Cardiovascular: Normal rate and regular rhythm.   Pulmonary/Chest: Effort normal and breath sounds normal.  Abdominal: Soft. Bowel sounds are normal. He exhibits no distension. There is tenderness. There is guarding. There is no rebound.  periumbilical tenderness, no CVA tenderness  Neurological: He is alert and oriented to person, place, and time.  Skin: Skin is warm.    ED Course  Procedures (including critical care time) Labs Review Labs Reviewed  CBC WITH DIFFERENTIAL - Abnormal; Notable for the following:    WBC 17.8 (*)    Neutrophils Relative % 90 (*)    Neutro Abs 16.0 (*)    Lymphocytes Relative 8 (*)    Monocytes Relative 2 (*)    All other components within normal limits  COMPREHENSIVE METABOLIC PANEL - Abnormal; Notable for the following:    Glucose, Bld 167 (*)    All other components within normal limits  MAGNESIUM - Abnormal; Notable for the following:    Magnesium 1.4 (*)    All other components within normal limits  URINE RAPID DRUG SCREEN (HOSP PERFORMED) - Abnormal; Notable for the following:    Tetrahydrocannabinol POSITIVE (*)  All other components within normal limits  LIPASE, BLOOD  URINALYSIS, ROUTINE W REFLEX MICROSCOPIC   Imaging Review Dg Abd Acute W/chest  07/18/2013   CLINICAL DATA:  Upper abdominal pain and vomiting.  EXAM: ACUTE ABDOMEN SERIES (ABDOMEN 2 VIEW & CHEST 1 VIEW)  COMPARISON:  Chest radiograph performed 07/08/2011  FINDINGS: The lungs are well-aerated and clear. There is no evidence of focal opacification, pleural effusion  or pneumothorax. The cardiomediastinal silhouette is within normal limits.  The visualized bowel gas pattern is unremarkable. Trace air is seen within the colon; there is no evidence of small bowel dilatation to suggest obstruction. No free intra-abdominal air is identified on the provided upright view.  No acute osseous abnormalities are seen; the sacroiliac joints are unremarkable in appearance.  IMPRESSION: 1. Unremarkable bowel gas pattern; no free intra-abdominal air seen. 2. No acute cardiopulmonary process seen.   Electronically Signed   By: Roanna Raider M.D.   On: 07/18/2013 02:27    EKG Interpretation   None       MDM  No diagnosis found. DDx includes: Pancreatitis Hepatobiliary pathology including cholecystitis Gastritis/PUD SBO ACS syndrome Aortic Dissection AAA Tumors Colitis/Gastroenteritis Intra abdominal abscess Thrombosis Mesenteric ischemia Diverticulitis Peritonitis Appendicitis Hernia Nephrolithiasis Pyelonephritis UTI/Cystitis  Pt comes in with cc of abd pain. He has no scrotal/testicular pain and therefore this doesn't appear to be GU in etiology. Tenderness over the periumbilical region - with leukocytosis at 17.8. Will get CT abd with contrast.  Derwood Kaplan, MD 07/18/13 0256   6:24 AM Pt's CT is negative. He feels a lot better, still has some pain, but no emesis and no diarrhea since ED arrival. Pt's exam shows no peritoneal signs, no RUQ tenderness, will d.c  Derwood Kaplan, MD 07/18/13 [phone]

## 2013-07-18 NOTE — ED Notes (Signed)
Bed: [id number] Expected date: 07/18/13 Expected time: 1:00 AM Means of arrival: Ambulance Comments: n,v

## 2013-08-02 ENCOUNTER — Emergency Department (HOSPITAL_COMMUNITY): Payer: Self-pay

## 2013-08-02 ENCOUNTER — Encounter (HOSPITAL_COMMUNITY): Payer: Self-pay | Admitting: Emergency Medicine

## 2013-08-02 ENCOUNTER — Emergency Department (HOSPITAL_COMMUNITY)
Admission: EM | Admit: 2013-08-02 | Discharge: 2013-08-02 | Disposition: A | Discharge status: Home / Self Care | Payer: Self-pay | Attending: Emergency Medicine | Admitting: Emergency Medicine

## 2013-08-02 DIAGNOSIS — F17200 Nicotine dependence, unspecified, uncomplicated: Secondary | ICD-10-CM | POA: Insufficient documentation

## 2013-08-02 DIAGNOSIS — J45909 Unspecified asthma, uncomplicated: Secondary | ICD-10-CM | POA: Insufficient documentation

## 2013-08-02 DIAGNOSIS — R1013 Epigastric pain: Secondary | ICD-10-CM | POA: Insufficient documentation

## 2013-08-02 DIAGNOSIS — R112 Nausea with vomiting, unspecified: Secondary | ICD-10-CM | POA: Insufficient documentation

## 2013-08-02 LAB — COMPREHENSIVE METABOLIC PANEL
ALBUMIN: 4.4 g/dL (ref 3.5–5.2)
ALK PHOS: 83 U/L (ref 39–117)
ALT: 14 U/L (ref 0–53)
AST: 16 U/L (ref 0–37)
BILIRUBIN TOTAL: 0.7 mg/dL (ref 0.3–1.2)
BUN: 17 mg/dL (ref 6–23)
CHLORIDE: 91 meq/L — AB (ref 96–112)
CO2: 26 meq/L (ref 19–32)
Calcium: 9.5 mg/dL (ref 8.4–10.5)
Creatinine, Ser: 0.92 mg/dL (ref 0.50–1.35)
GFR calc Af Amer: >90 mL/min (ref >90)
Glucose, Bld: 72 mg/dL (ref 70–99)
POTASSIUM: 4.6 meq/L (ref 3.7–5.3)
Sodium: 133 mEq/L — ABNORMAL LOW (ref 137–147)
Total Protein: 8.3 g/dL (ref 6.0–8.3)

## 2013-08-02 LAB — CBC WITH DIFFERENTIAL/PLATELET
Basophils Absolute: 0 10*3/uL (ref 0.0–0.1)
Basophils Relative: 0 % (ref 0–1)
EOS PCT: 0 % (ref 0–5)
Eosinophils Absolute: 0 10*3/uL (ref 0.0–0.7)
HCT: 48 % (ref 39.0–52.0)
HEMOGLOBIN: 17 g/dL (ref 13.0–17.0)
Lymphocytes Relative: 26 % (ref 12–46)
Lymphs Abs: 2.5 10*3/uL (ref 0.7–4.0)
MCH: 32.1 pg (ref 26.0–34.0)
MCHC: 35.4 g/dL (ref 30.0–36.0)
MCV: 90.7 fL (ref 78.0–100.0)
MONO ABS: 0.9 10*3/uL (ref 0.1–1.0)
MONOS PCT: 9 % (ref 3–12)
NEUTROS ABS: 6.2 10*3/uL (ref 1.7–7.7)
Neutrophils Relative %: 64 % (ref 43–77)
Platelets: 258 10*3/uL (ref 150–400)
RBC: 5.29 MIL/uL (ref 4.22–5.81)
RDW: 13.8 % (ref 11.5–15.5)
WBC: 9.7 10*3/uL (ref 4.0–10.5)

## 2013-08-02 LAB — URINE MICROSCOPIC-ADD ON

## 2013-08-02 LAB — LIPASE, BLOOD: LIPASE: 21 U/L (ref 11–59)

## 2013-08-02 LAB — URINALYSIS, ROUTINE W REFLEX MICROSCOPIC
GLUCOSE, UA: NEGATIVE mg/dL
HGB URINE DIPSTICK: NEGATIVE
Ketones, ur: >80 mg/dL — AB
Leukocytes, UA: NEGATIVE
Nitrite: NEGATIVE
Protein, ur: 30 mg/dL — AB
SPECIFIC GRAVITY, URINE: 1.03 (ref 1.005–1.030)
Urobilinogen, UA: 1 mg/dL (ref 0.0–1.0)
pH: 6 (ref 5.0–8.0)

## 2013-08-02 NOTE — Discharge Instructions (Signed)

## 2013-08-02 NOTE — ED Notes (Signed)
md at bedside  Pt alert and oriented x4. Respirations even and unlabored, bilateral symmetrical rise and fall of chest. Skin warm and dry. In no acute distress. Denies needs.   

## 2013-08-02 NOTE — ED Notes (Signed)
Pt informed he was not allowed to leave and go smoke

## 2013-08-02 NOTE — ED Notes (Signed)
Per pt, states he was here for the same symptoms on the 20th-states symptoms came back this past Sunday-states he vomits every time he has PO intake

## 2013-08-02 NOTE — Progress Notes (Signed)
[facility] provided pt with a list of primary care resources and [facility] information.  °

## 2013-08-02 NOTE — ED Provider Notes (Signed)
CSN: [id number]     Arrival date & time 08/02/13  1008 History   First MD Initiated Contact with Patient 08/02/13 1116     Chief Complaint  Patient presents with  . Emesis   (Consider location/radiation/quality/duration/timing/severity/associated sxs/prior Treatment) Patient is a 23 y.o. male presenting with vomiting.  Emesis Severity:  Moderate Duration:  2 weeks Timing:  Intermittent Quality:  Stomach contents Able to tolerate:  Liquids Progression:  Worsening Chronicity:  Recurrent Context comment:  Eval'd in the ED about 2 weeks ago, had negative CT scan.  Relieved by: phenergan. Worsened by:  Food smell Associated symptoms: no abdominal pain, no diarrhea and no fever     Past Medical History  Diagnosis Date  . Asthma    History reviewed. No pertinent past surgical history. No family history on file. History  Substance Use Topics  . Smoking status: Current Every Day Smoker -- 1.00 packs/day    Types: Cigarettes  . Smokeless tobacco: Not on file  . Alcohol Use: Yes     Comment: occasionally    Review of Systems  Constitutional: Negative for fever.  HENT: Negative for congestion.   Respiratory: Negative for cough and shortness of breath.   Cardiovascular: Negative for chest pain.  Gastrointestinal: Positive for nausea and vomiting. Negative for abdominal pain and diarrhea.  All other systems reviewed and are negative.    Allergies  Review of patient's allergies indicates no known allergies.  Home Medications   Current Outpatient Rx  Name  Route  Sig  Dispense  Refill  . ibuprofen (ADVIL,MOTRIN) 400 MG tablet   Oral   Take 1 tablet (400 mg total) by mouth every 6 (six) hours as needed.   30 tablet   0   . promethazine (PHENERGAN) 25 MG tablet   Oral   Take 1 tablet (25 mg total) by mouth every 6 (six) hours as needed for nausea.   30 tablet   0    BP 146/93  Pulse 66  Temp(Src) 98.2 F (36.8 C) (Oral)  Resp 16  SpO2 100% Physical Exam   Nursing note and vitals reviewed. Constitutional: He is oriented to person, place, and time. He appears well-developed and well-nourished. No distress.  HENT:  Head: Normocephalic and atraumatic.  Mouth/Throat: Oropharynx is clear and moist.  Eyes: Conjunctivae are normal. Pupils are equal, round, and reactive to light. No scleral icterus.  Neck: Neck supple.  Cardiovascular: Normal rate, regular rhythm, normal heart sounds and intact distal pulses.   No murmur heard. Pulmonary/Chest: Effort normal and breath sounds normal. No stridor. No respiratory distress. He has no wheezes. He has no rales.  Abdominal: Soft. He exhibits no distension. There is tenderness in the epigastric area. There is no rigidity, no rebound and no guarding.  Musculoskeletal: Normal range of motion. He exhibits no edema.  Neurological: He is alert and oriented to person, place, and time.  Skin: Skin is warm and dry. No rash noted.  Psychiatric: He has a normal mood and affect. His behavior is normal.    ED Course  Procedures (including critical care time) Labs Review Labs Reviewed  URINALYSIS, ROUTINE W REFLEX MICROSCOPIC - Abnormal; Notable for the following:    Bilirubin Urine SMALL (*)    Ketones, ur >80 (*)    Protein, ur 30 (*)    All other components within normal limits  COMPREHENSIVE METABOLIC PANEL - Abnormal; Notable for the following:    Sodium 133 (*)    Chloride 91 (*)  All other components within normal limits  CBC WITH DIFFERENTIAL  LIPASE, BLOOD  URINE MICROSCOPIC-ADD ON   Imaging Review [place] Abdomen Complete  08/02/2013   CLINICAL DATA:  Emesis  EXAM: ULTRASOUND ABDOMEN COMPLETE  COMPARISON:  CT abdomen and pelvis July 18, 2013  FINDINGS: Gallbladder:  No gallstones or wall thickening visualized. There is no pericholecystic fluid. No sonographic Murphy sign noted.  Common bile duct:  Diameter: 2 mm. There is no intrahepatic, common hepatic, or common bile duct dilatation.  Liver:  No  focal lesion identified. Within normal limits in parenchymal echogenicity. A degree of periportal edema noted on recent CT examination remains.  IVC:  No abnormality visualized.  Pancreas:  No mass or inflammatory focus.  Spleen:  Size and appearance within normal limits.  Right Kidney:  Length: 10.0 cm. Echogenicity within normal limits. No mass or hydronephrosis visualized. Fullness of the upper pole right collecting system appears stable compared to recent CT examination, however.  Left Kidney:  Length: 10.4 cm. Echogenicity within normal limits. No hydronephrosis visualized. There is a 6 x 6 x 6 mm simple cyst in the lower pole left kidney.  Abdominal aorta:  No aneurysm visualized.  Other findings:  No demonstrable ascites.  IMPRESSION: There is periportal edema in the liver. This is a stable finding. This finding could indicate underlying parenchymal disease such as hepatitis. No liver mass seen.  Fullness of the right upper pole collecting system remains. Question caliceal diverticulum.  Small lower pole left renal cyst.  Study otherwise unremarkable.   Electronically Signed   By: Bretta BangWilliam  Woodruff M.D.   On: 08/02/2013 14:13    EKG Interpretation   None       MDM   1. Nausea & vomiting    23 yo male with recurrent nausea and vomiting.  States he gets similar symptoms to this about twice per year.  On exam, well appearing, thin, mild epigastric abdominal pain.  Plan Labs and RUQ ultrasound.    US showed stable periportal edema.  This and other incidental findings relayed to patient.  His labs suggested an element of dehydration and his symptoms improved significantly after IV fluids.  He was discharged with return precautions and advised GI follow up for further evaluation of his recurrent nausea and vomiting.   Candyce ChurnJohn David Eder Macek III, MD 08/02/13 [phone]

## 2013-08-15 ENCOUNTER — Emergency Department (HOSPITAL_COMMUNITY): Payer: Self-pay

## 2013-08-15 ENCOUNTER — Encounter (HOSPITAL_COMMUNITY): Payer: Self-pay | Admitting: Emergency Medicine

## 2013-08-15 ENCOUNTER — Emergency Department (HOSPITAL_COMMUNITY)
Admission: EM | Admit: 2013-08-15 | Discharge: 2013-08-15 | Disposition: A | Discharge status: Home / Self Care | Payer: Self-pay | Attending: Emergency Medicine | Admitting: Emergency Medicine

## 2013-08-15 DIAGNOSIS — F17200 Nicotine dependence, unspecified, uncomplicated: Secondary | ICD-10-CM | POA: Insufficient documentation

## 2013-08-15 DIAGNOSIS — J45909 Unspecified asthma, uncomplicated: Secondary | ICD-10-CM | POA: Insufficient documentation

## 2013-08-15 DIAGNOSIS — R112 Nausea with vomiting, unspecified: Secondary | ICD-10-CM | POA: Insufficient documentation

## 2013-08-15 LAB — COMPREHENSIVE METABOLIC PANEL
ALT: 15 U/L (ref 0–53)
AST: 17 U/L (ref 0–37)
Albumin: 4 g/dL (ref 3.5–5.2)
Alkaline Phosphatase: 71 U/L (ref 39–117)
BUN: 9 mg/dL (ref 6–23)
CO2: 23 mEq/L (ref 19–32)
Calcium: 9.4 mg/dL (ref 8.4–10.5)
Chloride: 102 mEq/L (ref 96–112)
Creatinine, Ser: 0.78 mg/dL (ref 0.50–1.35)
GFR calc Af Amer: >90 mL/min (ref >90)
GFR calc non Af Amer: >90 mL/min (ref >90)
Glucose, Bld: 127 mg/dL — ABNORMAL HIGH (ref 70–99)
Potassium: 4 mEq/L (ref 3.7–5.3)
Sodium: 140 mEq/L (ref 137–147)
Total Bilirubin: <0.2 mg/dL — ABNORMAL LOW (ref 0.3–1.2)
Total Protein: 7.4 g/dL (ref 6.0–8.3)

## 2013-08-15 LAB — CBC WITH DIFFERENTIAL/PLATELET
Basophils Absolute: 0 10*3/uL (ref 0.0–0.1)
Basophils Relative: 0 % (ref 0–1)
Eosinophils Absolute: 0 10*3/uL (ref 0.0–0.7)
Eosinophils Relative: 0 % (ref 0–5)
HCT: 40.4 % (ref 39.0–52.0)
Hemoglobin: 13.8 g/dL (ref 13.0–17.0)
Lymphocytes Relative: 12 % (ref 12–46)
Lymphs Abs: 1.5 10*3/uL (ref 0.7–4.0)
MCH: 31.6 pg (ref 26.0–34.0)
MCHC: 34.2 g/dL (ref 30.0–36.0)
MCV: 92.4 fL (ref 78.0–100.0)
Monocytes Absolute: 0.5 10*3/uL (ref 0.1–1.0)
Monocytes Relative: 4 % (ref 3–12)
Neutro Abs: 10.4 10*3/uL — ABNORMAL HIGH (ref 1.7–7.7)
Neutrophils Relative %: 83 % — ABNORMAL HIGH (ref 43–77)
Platelets: 254 10*3/uL (ref 150–400)
RBC: 4.37 MIL/uL (ref 4.22–5.81)
RDW: 14.6 % (ref 11.5–15.5)
WBC: 12.5 10*3/uL — ABNORMAL HIGH (ref 4.0–10.5)

## 2013-08-15 LAB — URINALYSIS, ROUTINE W REFLEX MICROSCOPIC
Bilirubin Urine: NEGATIVE
Glucose, UA: NEGATIVE mg/dL
Hgb urine dipstick: NEGATIVE
Ketones, ur: NEGATIVE mg/dL
Leukocytes, UA: NEGATIVE
Nitrite: NEGATIVE
Protein, ur: NEGATIVE mg/dL
Specific Gravity, Urine: 1.019 (ref 1.005–1.030)
Urobilinogen, UA: 0.2 mg/dL (ref 0.0–1.0)
pH: 7.5 (ref 5.0–8.0)

## 2013-08-15 LAB — LIPASE, BLOOD: Lipase: 24 U/L (ref 11–59)

## 2013-08-15 MED ORDER — SODIUM CHLORIDE 0.9 % IV BOLUS (SEPSIS)
2000.0000 mL | Freq: Once | INTRAVENOUS | Status: AC
  Administered 2013-08-15: 2000 mL via INTRAVENOUS

## 2013-08-15 MED ORDER — PROCHLORPERAZINE EDISYLATE 5 MG/ML IJ SOLN: 10.0000 mg | Freq: Four times a day (QID) | INTRAMUSCULAR | Status: DC | PRN

## 2013-08-15 MED ORDER — LORAZEPAM 2 MG/ML IJ SOLN
1.0000 mg | Freq: Once | INTRAMUSCULAR | Status: AC
  Administered 2013-08-15: 1 mg via INTRAVENOUS
  Filled 2013-08-15: qty 1

## 2013-08-15 MED ORDER — HYDROMORPHONE HCL PF 1 MG/ML IJ SOLN
1.0000 mg | Freq: Once | INTRAMUSCULAR | Status: AC
  Administered 2013-08-15: 1 mg via INTRAVENOUS
  Filled 2013-08-15: qty 1

## 2013-08-15 MED ORDER — ONDANSETRON HCL 4 MG/2ML IJ SOLN
4.0000 mg | Freq: Once | INTRAMUSCULAR | Status: AC
  Administered 2013-08-15: 4 mg via INTRAVENOUS
  Filled 2013-08-15: qty 2

## 2013-08-15 MED ORDER — METOCLOPRAMIDE HCL 5 MG/ML IJ SOLN
10.0000 mg | Freq: Once | INTRAMUSCULAR | Status: AC
  Administered 2013-08-15: 10 mg via INTRAVENOUS
  Filled 2013-08-15: qty 2

## 2013-08-15 MED ORDER — MORPHINE SULFATE 4 MG/ML IJ SOLN
4.0000 mg | Freq: Once | INTRAMUSCULAR | Status: AC
  Administered 2013-08-15: 4 mg via INTRAVENOUS
  Filled 2013-08-15: qty 1

## 2013-08-15 MED ORDER — METOCLOPRAMIDE HCL 10 MG PO TABS: 10.0000 mg | ORAL_TABLET | Freq: Four times a day (QID) | ORAL | Status: DC

## 2013-08-15 NOTE — ED Notes (Signed)
Pt aware that urine sample is needed.  Gave pt a urinal, pt sts he will ring once he has used it.

## 2013-08-15 NOTE — ED Provider Notes (Signed)
CSN: [id number]     Arrival date & time 08/15/13  1101 History   First MD Initiated Contact with Patient 08/15/13 1119     Chief Complaint  Patient presents with  . Emesis     (Consider location/radiation/quality/duration/timing/severity/associated sxs/prior Treatment) HPI Patient presents emergency room with nausea, vomiting, that started earlier today.  She states that he took Phenergan without relief of his nausea and vomiting.  Patient, states, that he denies any chest pain, shortness of breath, weakness, dizziness, fever, back pain, dysuria, abdominal pain, syncope, rash or diarrhea.  The patient, states, that nothing seems to make his condition, better or worse.  Patient, states, that he's had symptoms similar to this in the past. Past Medical History  Diagnosis Date  . Asthma    History reviewed. No pertinent past surgical history. History reviewed. No pertinent family history. History  Substance Use Topics  . Smoking status: Current Every Day Smoker -- 1.00 packs/day    Types: Cigarettes  . Smokeless tobacco: Never Used  . Alcohol Use: Yes     Comment: occasionally    Review of Systems  All other systems negative except as documented in the HPI. All pertinent positives and negatives as reviewed in the HPI.  Allergies  Review of patient's allergies indicates no known allergies.  Home Medications   Current Outpatient Rx  Name  Route  Sig  Dispense  Refill  . promethazine (PHENERGAN) 25 MG tablet   Oral   Take 1 tablet (25 mg total) by mouth every 6 (six) hours as needed for nausea.   30 tablet   0    BP 135/85  Pulse 68  Temp(Src) 97.9 F (36.6 C) (Oral)  Resp 16  SpO2 100% Physical Exam  Nursing note and vitals reviewed. Constitutional: He is oriented to person, place, and time. He appears well-developed and well-nourished. No distress.  HENT:  Head: Normocephalic and atraumatic.  Mouth/Throat: Oropharynx is clear and moist.  Eyes: Pupils are equal,  round, and reactive to light.  Neck: Normal range of motion. Neck supple.  Cardiovascular: Normal rate, regular rhythm and normal heart sounds.  Exam reveals no gallop and no friction rub.   No murmur heard. Pulmonary/Chest: Effort normal and breath sounds normal. No respiratory distress. He has no wheezes.  Abdominal: Soft. Bowel sounds are normal. He exhibits no distension. There is no tenderness. There is no rebound and no guarding.  Musculoskeletal: He exhibits no edema.  Neurological: He is alert and oriented to person, place, and time.  Skin: Skin is warm and dry. No rash noted.    ED Course  Procedures (including critical care time) Labs Review Labs Reviewed  CBC WITH DIFFERENTIAL - Abnormal; Notable for the following:    WBC 12.5 (*)    Neutrophils Relative % 83 (*)    Neutro Abs 10.4 (*)    All other components within normal limits  COMPREHENSIVE METABOLIC PANEL - Abnormal; Notable for the following:    Glucose, Bld 127 (*)    Total Bilirubin <0.2 (*)    All other components within normal limits  LIPASE, BLOOD  URINALYSIS, ROUTINE W REFLEX MICROSCOPIC   patient has had multiple episodes similar to this in the past.  The patient had CT scan and ultrasound done within the last month.  Patient, mostly has gastroparesis waiting for further labs, and we'll check the patient for the ability to hold down oral fluids. Kirichenko PA-C will follow up on patients labs and reckeck.  Jamesetta Orleanshristopher W  Ryosuke Ericksen, PA-C 08/15/13 1617

## 2013-08-15 NOTE — ED Notes (Signed)
Patient reported to the tech that he has been vomiting to releive the stomach pain. EDPA notified.

## 2013-08-15 NOTE — ED Notes (Signed)
Per EMS- patient c/o vomiting x 2 times and stated he saw blood in the vomit. EMS states that in the trash can was 2 bottles of red Hi-c that the patient had drank earlier. Patient states he took Phenergan with no relief.

## 2013-08-15 NOTE — ED Notes (Addendum)
Patient walked up to the desk requesting pain medicine approx 5 minutes after REglan given. Patient walked back to his room and began vomiting.

## 2013-08-15 NOTE — Discharge Instructions (Signed)
Continue reglan and phenergan at home. Stop smoking weed, this may be causing your symptoms. Follow up with gastroenterologist.    Cyclic Vomiting Syndrome Cyclic vomiting syndrome is a benign condition in which patients experience bouts or cycles of severe nausea and vomiting that last for hours or even days. The bouts of nausea and vomiting alternate with longer periods of no symptoms and generally good health. Cyclic vomiting syndrome occurs mostly in children, but can affect adults. CAUSES  [facility] has no known cause. Each episode is typically similar to the previous ones. The episodes tend to:   Start at about the same time of day.  Last the same length of time.  Present the same symptoms at the same level of intensity. Cyclic vomiting syndrome can begin at any age in children and adults. Cyclic vomiting syndrome usually starts between the ages of 3 and 7 years. In adults, episodes tend to occur less often than they do in children, but they last longer. Furthermore, the events or situations that trigger episodes in adults cannot always be pinpointed as easily as they can in children. There are 4 phases of cyclic vomiting syndrome: 1. Prodrome. The prodrome phase signals that an episode of nausea and vomiting is about to begin. This phase can last from just a few minutes to several hours. This phase is often marked by belly (abdominal) pain. Sometimes taking medicine early in the prodrome phase can stop an episode in progress. However, sometimes there is no warning. A person may simply wake up in the middle of the night or early morning and begin vomiting. 2. Episode. The episode phase consists of:  Severe vomiting.  Nausea.  Gagging (retching). 3. Recovery. The recovery phase begins when the nausea and vomiting stop. Healthy color, appetite, and energy return. 4. Symptom-free interval. The symptom-free interval phase is the period between episodes when no symptoms are  present. TRIGGERS Episodes can be triggered by an infection or event. Examples of triggers include:  Infections.  Colds, allergies, sinus problems, and the flu.  Eating certain foods such as chocolate or cheese.  Foods with monosodium glutamate (MSG) or preservatives.  Fast foods.  Pre-packaged foods.  Foods with low nutritional value (junk foods).  Overeating.  Eating just before going to bed.  Hot weather.  Dehydration.  Not enough sleep or poor sleep quality.  Physical exhaustion.  Menstruation.  Motion sickness.  Emotional stress (school or home difficulties).  Excitement or stress. SYMPTOMS  The main symptoms of cyclic vomiting syndrome are:  Severe vomiting.  Nausea.  Gagging (retching). Episodes usually begin at night or the first thing in the morning. Episodes may include vomiting or retching up to 5 or 6 times an hour during the worst of the episode. Episodes usually last anywhere from 1 to 4 days. Episodes can last for up to 10 days. Other symptoms include:  Paleness.  Exhaustion.  Listlessness.  Abdominal pain.  Loose stools or diarrhea. Sometimes the nausea and vomiting are so severe that a person appears to be almost unconscious. Sensitivity to light, headache, fever, dizziness, may also accompany an episode. In addition, the vomiting may cause drooling and excessive thirst. Drinking water usually leads to more vomiting, though the water can dilute the acid in the vomit, making the episode a little less painful. Continuous vomiting can lead to dehydration, which means that the body has lost excessive water and salts. DIAGNOSIS  Cyclic vomiting syndrome is hard to diagnose because there are no clear tests to identify  it. A caregiver must diagnose cyclic vomiting syndrome by looking at symptoms and medical history. A caregiver must exclude more common diseases or disorders that can also cause nausea and vomiting. Also, diagnosis takes time because  caregivers need to identify a pattern or cycle to the vomiting. TREATMENT  Cyclic vomiting syndrome cannot be cured. Treatment varies, but people with cyclic vomiting syndrome should get plenty of rest and sleep and take medications that prevent, stop, or lessen the vomiting episodes and other symptoms. People whose episodes are frequent and long-lasting may be treated during the symptom-free intervals in an effort to prevent or ease future episodes. The symptom-free phase is a good time to eliminate anything known to trigger an episode. For example, if episodes are brought on by stress or excitement, this period is the time to find ways to reduce stress and stay calm. If sinus problems or allergies cause episodes, those conditions should be treated. The triggers listed above should be avoided or prevented. Because of the similarities between migraine and cyclic vomiting syndrome, caregivers treat some people with severe cyclic vomiting syndrome with drugs that are also used for migraine headaches. The drugs are designed to:  Prevent episodes.  Reduce their frequency.  Lessen their severity. HOME CARE INSTRUCTIONS Once a vomiting episode begins, treatment is supportive. It helps to stay in bed and sleep in a dark, quiet room. Severe nausea and vomiting may require hospitalization and intravenous (IV) fluids to prevent dehydration. Relaxing medications (sedatives) may help if the nausea continues. Sometimes, during the prodrome phase, it is possible to stop an episode from happening altogether. Only take over-the-counter or prescription medicines for pain, discomfort or fever as directed by your caregiver. Do not give aspirin to children. During the recovery phase, drinking water and replacing lost electrolytes (salts in the blood) are very important. Electrolytes are salts that the body needs to function well and stay healthy. Symptoms during the recovery phase can vary. Some people find that their  appetites return to normal immediately, while others need to begin by drinking clear liquids and then move slowly to solid food. RELATED COMPLICATIONS The severe vomiting that defines cyclic vomiting syndrome is a risk factor for several complications:  Dehydration Vomiting causes the body to lose water quickly.  Electrolyte imbalance Vomiting also causes the body to lose the important salts it needs to keep working properly.  Peptic esophagitis The tube that connects the mouth to the stomach (esophagus) becomes injured from the stomach acid that comes up with the vomit.  Hematemesis The esophagus becomes irritated and bleeds, so blood mixes with the vomit.  Mallory-Weiss tear The lower end of the esophagus may tear open or the stomach may bruise from vomiting or retching.  Tooth decay The acid in the vomit can hurt the teeth by corroding the tooth enamel. SEEK MEDICAL CARE IF: You have questions or problems. Document Released: 08/24/2001 Document Revised: 09/07/2011 Document Reviewed: 09/22/2010 [facility] Center[facility] Patient Information 2014 [employer], [place].

## 2013-08-15 NOTE — ED Notes (Signed)
Patient took one sip of water when fluids offered. Explained to patient that he needed to drink more to see how he tolerated, but covered his head up with the blanket. Patient's sister began saying that the patient has been here several times and nothing was being done for him. EDPA notified .

## 2013-08-15 NOTE — ED Provider Notes (Signed)
Medical screening examination/treatment/procedure(s) were performed by non-physician practitioner and as supervising physician I was immediately available for consultation/collaboration.  EKG Interpretation   None         Kenyona Rena, MD 08/15/13 1920 

## 2013-08-15 NOTE — ED Notes (Signed)
Bed: WA18 Expected date:  Expected time:  Means of arrival:  Comments: EMS-vomiting 

## 2013-08-15 NOTE — ED Notes (Signed)
Patient had clear emesis in the emesis bag prior to giving Reglan..Marland Kitchen

## 2013-08-15 NOTE — ED Provider Notes (Signed)
Robert Bryant is a 23 y.o. male who was signed out to me at shift change. Patient with nausea, vomiting, abdominal pain for several days. Patient is treated for his symptoms. His blood work is unremarkable. Abdomen is benign.  5:34 PM Patient received multiple doses of antiemetics and pain medications. He is now feeling better. He states his nausea resolved. He's tolerated one couple water. Patient admits to me that he smokes weed daily and has been given up for multiple years. Have instructed him to stop smoking weed, this could be contributing to his cyclical vomiting. Will also follow him up with a gastroenterologist. We'll prescribe Reglan, also advised to try hot showers at home. Vital signs are normal. Patient stable for discharge at this time.   Filed Vitals:   08/15/13 1107  BP: 135/85  Pulse: 68  Temp: 97.9 F (36.6 C)  Resp: 16      Kannen Moxey A Fredrich Cory, PA-C 08/15/13 1735

## 2013-08-15 NOTE — ED Provider Notes (Addendum)
Medical screening examination/treatment/procedure(s) were conducted as a shared visit with non-physician practitioner(s) and myself.  I personally evaluated the patient during the encounter.  EKG Interpretation   None       Patient is overall well-appearing.  He does continue to vomit in the room.  I suspect this is gastroparesis.  Patient be given additional antibiotics as well as anxiolytics at this time.  Additional pain medicine.  The patient's had ultrasound CT imaging are ready in 2015 without pathology noted.  I think the patient really needs close outpatient GI followup.  We will attempt to control the patient's symptoms at this time.  Lyanne CoKevin M Rayan Ines, MD 08/15/13 [phone]

## 2014-03-18 ENCOUNTER — Encounter (HOSPITAL_COMMUNITY): Payer: Self-pay | Admitting: Emergency Medicine

## 2014-03-18 ENCOUNTER — Emergency Department (HOSPITAL_COMMUNITY)
Admission: EM | Admit: 2014-03-18 | Discharge: 2014-03-18 | Disposition: A | Discharge status: Home / Self Care | Payer: Self-pay | Attending: Emergency Medicine | Admitting: Emergency Medicine

## 2014-03-18 DIAGNOSIS — K2990 Gastroduodenitis, unspecified, without bleeding: Principal | ICD-10-CM

## 2014-03-18 DIAGNOSIS — F17200 Nicotine dependence, unspecified, uncomplicated: Secondary | ICD-10-CM | POA: Insufficient documentation

## 2014-03-18 DIAGNOSIS — K2970 Gastritis, unspecified, without bleeding: Secondary | ICD-10-CM | POA: Insufficient documentation

## 2014-03-18 DIAGNOSIS — R112 Nausea with vomiting, unspecified: Secondary | ICD-10-CM | POA: Insufficient documentation

## 2014-03-18 DIAGNOSIS — J45909 Unspecified asthma, uncomplicated: Secondary | ICD-10-CM | POA: Insufficient documentation

## 2014-03-18 MED ORDER — ONDANSETRON 4 MG PO TBDP: 4.0000 mg | ORAL_TABLET | Freq: Three times a day (TID) | ORAL | Status: DC | PRN

## 2014-03-18 MED ORDER — FAMOTIDINE 20 MG PO TABS: 20.0000 mg | ORAL_TABLET | Freq: Two times a day (BID) | ORAL | Status: DC

## 2014-03-18 MED ORDER — FAMOTIDINE 20 MG PO TABS
40.0000 mg | ORAL_TABLET | Freq: Once | ORAL | Status: AC
  Administered 2014-03-18: 40 mg via ORAL
  Filled 2014-03-18: qty 2

## 2014-03-18 NOTE — ED Notes (Signed)
Pt sts developed nausea yesterday with mild abdominal pain, sts wants to make sure that he doesn't have stomach virus "before diarrhea starts". Pt denies nausea at this time, sts abdominal pain 2/10

## 2014-03-18 NOTE — ED Notes (Signed)
Pt states that he has been having NV since yesterday.  "I usually got the stomach virus.  But it ain't worser than it normally is.  I didn't throw up none today." Last emesis yesterday morning.  "I just wanna catch it before I get the diarrhea."

## 2014-03-18 NOTE — Discharge Instructions (Signed)
Please read and follow all provided instructions.  Your diagnoses today include:  1. Gastritis     Tests performed today include:  Vital signs. See below for your results today.   Medications prescribed:   Pepcid (famotidine) - antihistamine  You can find this medication over-the-counter.   DO NOT exceed:    Pepcid every 12 hours   Zofran (ondansetron) - for nausea and vomiting  Take any prescribed medications only as directed.  Home care instructions:   Follow any educational materials contained in this packet.   You should rest for the next several days. Keep drinking plenty of fluids and use the medicine for nausea as directed.    Drink clear liquids for the next 24 hours and introduce solid foods slowly after 24 hours using the b.r.a.t. diet (Bananas, Rice, Applesauce, Toast, Yogurt).    Follow-up instructions: Please follow-up with your primary care provider in the next 7 days for further evaluation of your symptoms. If you are not feeling better in 48 hours you may have a condition that is more serious and you need re-evaluation.   Return instructions:  SEEK IMMEDIATE MEDICAL ATTENTION IF:  If you have pain that does not go away or becomes severe   A temperature above 101F develops   Repeated vomiting occurs (multiple episodes)   If you have pain that becomes localized to portions of the abdomen. The right side could possibly be appendicitis. In an adult, the left lower portion of the abdomen could be colitis or diverticulitis.   Blood is being passed in stools or vomit (bright red or black tarry stools)   You develop chest pain, difficulty breathing, dizziness or fainting, or become confused, poorly responsive, or inconsolable (young children)  If you have any other emergent concerns regarding your health  Additional Information: Abdominal (belly) pain can be caused by many things. Your caregiver performed an examination and possibly ordered  blood/urine tests and imaging (CT scan, x-rays, ultrasound). Many cases can be observed and treated at home after initial evaluation in the emergency department. Even though you are being discharged home, abdominal pain can be unpredictable. Therefore, you need a repeated exam if your pain does not resolve, returns, or worsens. Most patients with abdominal pain don't have to be admitted to the hospital or have surgery, but serious problems like appendicitis and gallbladder attacks can start out as nonspecific pain. Many abdominal conditions cannot be diagnosed in one visit, so follow-up evaluations are very important.  Your vital signs today were: BP 119/70   Pulse 65   Temp(Src) 98.1 F (36.7 C) (Oral)   Resp 18   SpO2 100% If your blood pressure (bp) was elevated above 135/85 this visit, please have this repeated by your doctor within one month. --------------

## 2014-03-18 NOTE — ED Provider Notes (Signed)
CSN: [id number]     Arrival date & time 03/18/14  0815 History   First MD Initiated Contact with Patient 03/18/14 [phone]     Chief Complaint  Patient presents with  . Nausea  . Emesis     (Consider location/radiation/quality/duration/timing/severity/associated sxs/prior Treatment) HPI Comments: Patient presents with complaint of epigastric pain, nausea and vomiting since yesterday morning. Patient reports drinking alcohol the night prior. Patient states that he used to drink frequently and it would cause him to have nausea and vomiting like he is having now. Patients largely cut out alcohol and states that he has felt much better recently. Since he hadn't had symptoms in awhile he thought that he could drink. He denies any fevers. No diarrhea or blood in stool. No urinary symptoms. No treatments prior to arrival. Vomiting is nonbloody and nonbilious. Patient last vomited yesterday morning. He vomited a total of 3 times. He can also concerned that he could have a stomach virus. No history of abdominal surgeries. Patient had a CT scan earlier this year which was largely normal.  Patient is a 23 y.o. male presenting with vomiting. The history is provided by the patient and medical records.  Emesis Associated symptoms: abdominal pain   Associated symptoms: no diarrhea, no headaches, no myalgias and no sore throat     Past Medical History  Diagnosis Date  . Asthma    No past surgical history on file. No family history on file. History  Substance Use Topics  . Smoking status: Current Every Day Smoker -- 1.00 packs/day    Types: Cigarettes  . Smokeless tobacco: Never Used  . Alcohol Use: Yes     Comment: occasionally    Review of Systems  Constitutional: Negative for fever.  HENT: Negative for rhinorrhea and sore throat.   Eyes: Negative for redness.  Respiratory: Negative for cough.   Cardiovascular: Negative for chest pain.  Gastrointestinal: Positive for nausea, vomiting and  abdominal pain. Negative for diarrhea.  Genitourinary: Negative for dysuria.  Musculoskeletal: Negative for myalgias.  Skin: Negative for rash.  Neurological: Negative for headaches.    Allergies  Review of patient's allergies indicates no known allergies.  Home Medications   Prior to Admission medications   Not on File   BP 119/70  Pulse 65  Temp(Src) 98.1 F (36.7 C) (Oral)  Resp 18  SpO2 100%  Physical Exam  Nursing note and vitals reviewed. Constitutional: He appears well-developed and well-nourished.  HENT:  Head: Normocephalic and atraumatic.  Eyes: Conjunctivae are normal. Right eye exhibits no discharge. Left eye exhibits no discharge.  Neck: Normal range of motion. Neck supple.  Cardiovascular: Normal rate, regular rhythm and normal heart sounds.   Pulmonary/Chest: Effort normal and breath sounds normal.  Abdominal: Soft. He exhibits no distension. There is tenderness (mild epigastric) in the epigastric area. There is no rigidity, no rebound, no guarding, no CVA tenderness, no tenderness at McBurney's point and negative Murphy's sign.  Neurological: He is alert.  Skin: Skin is warm and dry.  Psychiatric: He has a normal mood and affect.    ED Course  Procedures (including critical care time) Labs Review Labs Reviewed - No data to display  Imaging Review No results found.   EKG Interpretation None      9:14 AM Patient seen and examined. Medications ordered. Anticipate d/c to home after PO challenge.   Vital signs reviewed and are as follows: BP 119/70  Pulse 65  Temp(Src) 98.1 F (36.7 C) (Oral)  Resp  18  SpO2 100%  9:37 AM Patient doing well, will discharge to home with pepcid and zofran for symptom control. Counseled on brat diet, clear liquids.   The patient was urged to return to the Emergency Department immediately with worsening of current symptoms, worsening abdominal pain, persistent vomiting, blood noted in stools, fever, or any other  concerns. The patient verbalized understanding.   PCP referral given.    MDM   Final diagnoses:  Gastritis   Patient with symptoms consistent with gastritis after drinking alcohol. Patient has history of the same. Patient's symptoms have been gradually improving he has not vomited for 24 hours. He has only mild epigastric pain on exam, abdomen is soft. Vitals are stable, no fever. No signs of dehydration, tolerating PO's. Lungs are clear. No focal abdominal pain, no concern for appendicitis, cholecystitis, pancreatitis, ruptured viscus, UTI, kidney stone, or any other serious abdominal etiology at this time. Supportive therapy indicated with return if symptoms worsen. Patient counseled.     Renne Crigler, PA-C 03/18/14 [phone]

## 2014-03-19 NOTE — ED Provider Notes (Signed)
Medical screening examination/treatment/procedure(s) were performed by non-physician practitioner and as supervising physician I was immediately available for consultation/collaboration.   EKG Interpretation None        Teighan Aubert T Lashante Fryberger, MD 03/19/14 0941 

## 2015-03-03 ENCOUNTER — Encounter (HOSPITAL_COMMUNITY): Payer: Self-pay | Admitting: *Deleted

## 2015-03-03 ENCOUNTER — Emergency Department (HOSPITAL_COMMUNITY)
Admission: EM | Admit: 2015-03-03 | Discharge: 2015-03-03 | Disposition: A | Discharge status: Home / Self Care | Payer: Self-pay | Attending: Emergency Medicine | Admitting: Emergency Medicine

## 2015-03-03 DIAGNOSIS — R1115 Cyclical vomiting syndrome unrelated to migraine: Secondary | ICD-10-CM

## 2015-03-03 DIAGNOSIS — G43A0 Cyclical vomiting, not intractable: Secondary | ICD-10-CM | POA: Insufficient documentation

## 2015-03-03 DIAGNOSIS — F1210 Cannabis abuse, uncomplicated: Secondary | ICD-10-CM

## 2015-03-03 DIAGNOSIS — J45909 Unspecified asthma, uncomplicated: Secondary | ICD-10-CM | POA: Insufficient documentation

## 2015-03-03 DIAGNOSIS — Z720 Tobacco use: Secondary | ICD-10-CM | POA: Insufficient documentation

## 2015-03-03 MED ORDER — SODIUM CHLORIDE 0.9 % IV BOLUS (SEPSIS)
1000.0000 mL | Freq: Once | INTRAVENOUS | Status: AC
  Administered 2015-03-03: 1000 mL via INTRAVENOUS

## 2015-03-03 MED ORDER — HALOPERIDOL LACTATE 5 MG/ML IJ SOLN
5.0000 mg | Freq: Once | INTRAMUSCULAR | Status: AC
  Administered 2015-03-03: 5 mg via INTRAVENOUS
  Filled 2015-03-03: qty 1

## 2015-03-03 NOTE — ED Provider Notes (Signed)
CSN: [id number]     Arrival date & time 03/03/15  1811 History   First MD Initiated Contact with Patient 03/03/15 1820     Chief Complaint  Patient presents with  . Emesis     (Consider location/radiation/quality/duration/timing/severity/associated sxs/prior Treatment) Patient is a 24 y.o. male presenting with vomiting. The history is provided by the patient.  Emesis Severity:  Moderate Duration:  3 hours Timing:  Constant Quality:  Stomach contents Progression:  Unchanged Chronicity:  New Recent urination:  Normal Context: self-induced (occasionally)   Relieved by:  Nothing Worsened by:  Nothing tried Ineffective treatments:  None tried Associated symptoms: no abdominal pain, no diarrhea, no fever and no URI   Risk factors: no diabetes   Risk factors comment:  Heavy marijuana use, multiple prior visits for same   Past Medical History  Diagnosis Date  . Asthma    History reviewed. No pertinent past surgical history. History reviewed. No pertinent family history. Social History  Substance Use Topics  . Smoking status: Current Every Day Smoker -- 1.00 packs/day    Types: Cigarettes  . Smokeless tobacco: Never Used  . Alcohol Use: Yes     Comment: occasionally    Review of Systems  Gastrointestinal: Positive for vomiting. Negative for abdominal pain and diarrhea.  All other systems reviewed and are negative.     Allergies  Review of patient's allergies indicates no known allergies.  Home Medications   Prior to Admission medications   Medication Sig Start Date End Date Taking? Authorizing Provider  promethazine (PHENERGAN) 12.5 MG tablet Take 12.5 mg by mouth every 6 (six) hours as needed for nausea or vomiting.   Yes Historical Provider, MD   BP 128/85 mmHg  Pulse 65  Temp(Src) 98.1 F (36.7 C)  Resp 14  SpO2 100% Physical Exam  Constitutional: He is oriented to person, place, and time. He appears well-developed and well-nourished. No distress.  HENT:   Head: Normocephalic and atraumatic.  Eyes: Conjunctivae are normal.  Neck: Neck supple. No tracheal deviation present.  Cardiovascular: Normal rate, regular rhythm and normal heart sounds.   Pulmonary/Chest: Effort normal and breath sounds normal. No respiratory distress.  Abdominal: Soft. He exhibits no distension. There is no tenderness. There is no rebound and no guarding.  Neurological: He is alert and oriented to person, place, and time.  Skin: Skin is warm and dry.  Psychiatric: He has a normal mood and affect.    ED Course  Procedures (including critical care time) Labs Review Labs Reviewed - No data to display  Imaging Review No results found. I have personally reviewed and evaluated these images and lab results as part of my medical decision-making.   EKG Interpretation None      MDM   Final diagnoses:  Non-intractable cyclical vomiting with nausea  Marijuana abuse   24 year old male with previous multiple visits for intractable vomiting presents after eating at [place] [employer] earlier today and having vomiting and persistent nausea that he feels he must put his fingers in the back of his throat and induced vomiting to help. No indication for labs or imaging currently as patient is otherwise healthy, normal vital signs, reassuring exam. He has taken Phenergan without relief at home. He is requesting pain medicines on arrival. He was provided a single dose of IV Haldol for what appears to be a THC induced phenomenon given his heavy daily use of marijuana and typical symptoms. Without diarrhea or abdominal pain this is less likely a foodborne  illness. I was called back to the room for the patient being concerned that he is not receiving any pain medications, I informed him that we would monitor him until he felt better and he stated "I already feel better" so he was discharged with plan to establish a primary care physician for any ongoing issues. Return precautions were  discussed for worsening.  Lyndal Pulley, MD 03/03/15 [phone]

## 2015-03-03 NOTE — Discharge Instructions (Signed)
Cyclic Vomiting Syndrome °Cyclic vomiting syndrome is a benign condition in which patients experience bouts or cycles of severe nausea and vomiting that last for hours or even days. The bouts of nausea and vomiting alternate with longer periods of no symptoms and generally good health. Cyclic vomiting syndrome occurs mostly in children, but can affect adults. °CAUSES  °CVS has no known cause. Each episode is typically similar to the previous ones. The episodes tend to:  °· Start at about the same time of day. °· Last the same length of time. °· Present the same symptoms at the same level of intensity. °Cyclic vomiting syndrome can begin at any age in children and adults. Cyclic vomiting syndrome usually starts between the ages of 3 and 7 years. In adults, episodes tend to occur less often than they do in children, but they last longer. Furthermore, the events or situations that trigger episodes in adults cannot always be pinpointed as easily as they can in children. °There are 4 phases of cyclic vomiting syndrome: °1. Prodrome. The prodrome phase signals that an episode of nausea and vomiting is about to begin. This phase can last from just a few minutes to several hours. This phase is often marked by belly (abdominal) pain. Sometimes taking medicine early in the prodrome phase can stop an episode in progress. However, sometimes there is no warning. A person may simply wake up in the middle of the night or early morning and begin vomiting. °2. Episode. The episode phase consists of: °· Severe vomiting. °· Nausea. °· Gagging (retching). °3. Recovery. The recovery phase begins when the nausea and vomiting stop. Healthy color, appetite, and energy return. °4. Symptom-free interval. The symptom-free interval phase is the period between episodes when no symptoms are present. °TRIGGERS °Episodes can be triggered by an infection or event. Examples of triggers include: °· Infections. °· Colds, allergies, sinus problems, and  the flu. °· Eating certain foods such as chocolate or cheese. °· Foods with monosodium glutamate (MSG) or preservatives. °· Fast foods. °· Pre-packaged foods. °· Foods with low nutritional value (junk foods). °· Overeating. °· Eating just before going to bed. °· Hot weather. °· Dehydration. °· Not enough sleep or poor sleep quality. °· Physical exhaustion. °· Menstruation. °· Motion sickness. °· Emotional stress (school or home difficulties). °· Excitement or stress. °SYMPTOMS  °The main symptoms of cyclic vomiting syndrome are: °· Severe vomiting. °· Nausea. °· Gagging (retching). °Episodes usually begin at night or the first thing in the morning. Episodes may include vomiting or retching up to 5 or 6 times an hour during the worst of the episode. Episodes usually last anywhere from 1 to 4 days. Episodes can last for up to 10 days. Other symptoms include: °· Paleness. °· Exhaustion. °· Listlessness. °· Abdominal pain. °· Loose stools or diarrhea. °Sometimes the nausea and vomiting are so severe that a person appears to be almost unconscious. Sensitivity to light, headache, fever, dizziness, may also accompany an episode. In addition, the vomiting may cause drooling and excessive thirst. Drinking water usually leads to more vomiting, though the water can dilute the acid in the vomit, making the episode a little less painful. Continuous vomiting can lead to dehydration, which means that the body has lost excessive water and salts. °DIAGNOSIS  °Cyclic vomiting syndrome is hard to diagnose because there are no clear tests to identify it. A caregiver must diagnose cyclic vomiting syndrome by looking at symptoms and medical history. A caregiver must exclude more common diseases   or disorders that can also cause nausea and vomiting. Also, diagnosis takes time because caregivers need to identify a pattern or cycle to the vomiting. °TREATMENT  °Cyclic vomiting syndrome cannot be cured. Treatment varies, but people with  cyclic vomiting syndrome should get plenty of rest and sleep and take medications that prevent, stop, or lessen the vomiting episodes and other symptoms. °People whose episodes are frequent and long-lasting may be treated during the symptom-free intervals in an effort to prevent or ease future episodes. The symptom-free phase is a good time to eliminate anything known to trigger an episode. For example, if episodes are brought on by stress or excitement, this period is the time to find ways to reduce stress and stay calm. If sinus problems or allergies cause episodes, those conditions should be treated. The triggers listed above should be avoided or prevented. °Because of the similarities between migraine and cyclic vomiting syndrome, caregivers treat some people with severe cyclic vomiting syndrome with drugs that are also used for migraine headaches. The drugs are designed to: °· Prevent episodes. °· Reduce their frequency. °· Lessen their severity. °HOME CARE INSTRUCTIONS °Once a vomiting episode begins, treatment is supportive. It helps to stay in bed and sleep in a dark, quiet room. Severe nausea and vomiting may require hospitalization and intravenous (IV) fluids to prevent dehydration. Relaxing medications (sedatives) may help if the nausea continues. Sometimes, during the prodrome phase, it is possible to stop an episode from happening altogether. Only take over-the-counter or prescription medicines for pain, discomfort or fever as directed by your caregiver. Do not give aspirin to children. °During the recovery phase, drinking water and replacing lost electrolytes (salts in the blood) are very important. Electrolytes are salts that the body needs to function well and stay healthy. Symptoms during the recovery phase can vary. Some people find that their appetites return to normal immediately, while others need to begin by drinking clear liquids and then move slowly to solid food. °RELATED COMPLICATIONS °The  severe vomiting that defines cyclic vomiting syndrome is a risk factor for several complications: °· Dehydration--Vomiting causes the body to lose water quickly. °· Electrolyte imbalance--Vomiting also causes the body to lose the important salts it needs to keep working properly. °· Peptic esophagitis--The tube that connects the mouth to the stomach (esophagus) becomes injured from the stomach acid that comes up with the vomit. °· Hematemesis--The esophagus becomes irritated and bleeds, so blood mixes with the vomit. °· Mallory-Weiss tear--The lower end of the esophagus may tear open or the stomach may bruise from vomiting or retching. °· Tooth decay--The acid in the vomit can hurt the teeth by corroding the tooth enamel. °SEEK MEDICAL CARE IF: °You have questions or problems. °Document Released: 08/24/2001 Document Revised: 09/07/2011 Document Reviewed: 09/22/2010 °[employer]® Patient Information ©2015 [employer]. This information is not intended to replace advice given to you by your health care provider. Make sure you discuss any questions you have with your health care provider. ° °

## 2015-03-03 NOTE — ED Notes (Signed)
Bed: ZO10 Expected date:  Expected time:  Means of arrival:  Comments: 69M n/v/d/ x3hrs

## 2015-03-03 NOTE — ED Notes (Signed)
Went to [facility] today at 1400, an hour later began having vomiting and diarrhea. Took a phenergan 30 mins ago with no relief. Bilious emesis en route.  cbg 120 BP 122/80 HR 76 RR 16

## 2015-03-03 NOTE — ED Notes (Signed)
MD at bedside. Pt reports feeling better.

## 2015-06-20 IMAGING — CT CT ABD-PELV W/ CM
1 of 2 series · 15 of 32 positions shown, 19 images · IV contrast (100 ML OMNI 300)
Comparison: Abdominal radiograph performed earlier today at [DATE]
a.m.

CLINICAL DATA: Abdominal pain, nausea and vomiting.  Leukocytosis.

EXAM:
CT ABDOMEN AND PELVIS WITH CONTRAST
TECHNIQUE: Multidetector CT imaging of the abdomen and pelvis was performed
using the standard protocol following bolus administration of
intravenous contrast.
CONTRAST:  100mL OMNIPAQUE IOHEXOL 300 MG/ML  SOLN

[Series 2: abd/pel with · axial · 0.58mm/px · z∈[+1117,+1467]mm · 15 of 78 slices shown, 19 images]
[im 4/78  soft-tissue]
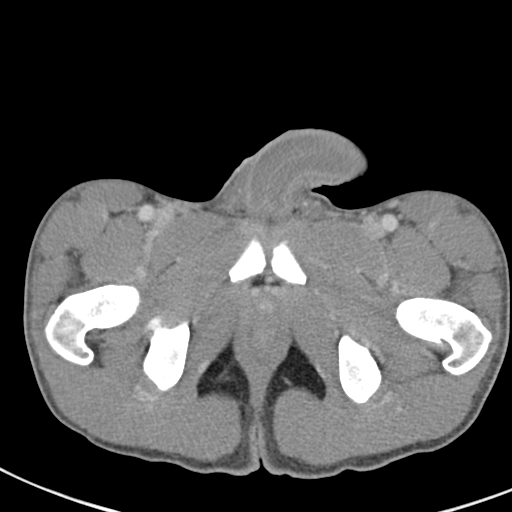
[im 4/78  bone]
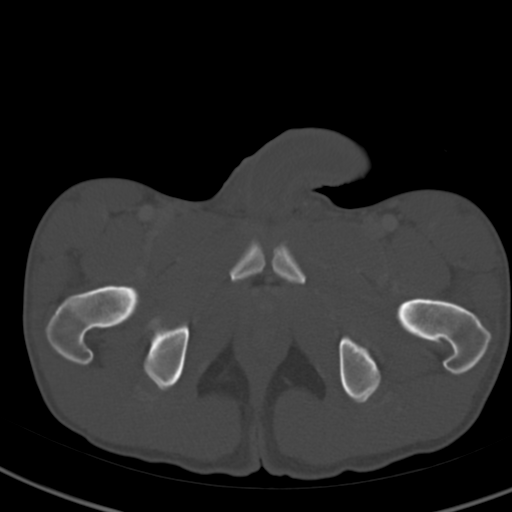
[im 10/78  soft-tissue]
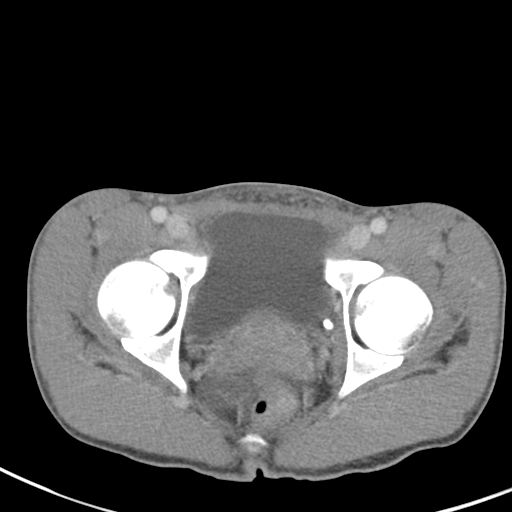
[im 16/78  soft-tissue]
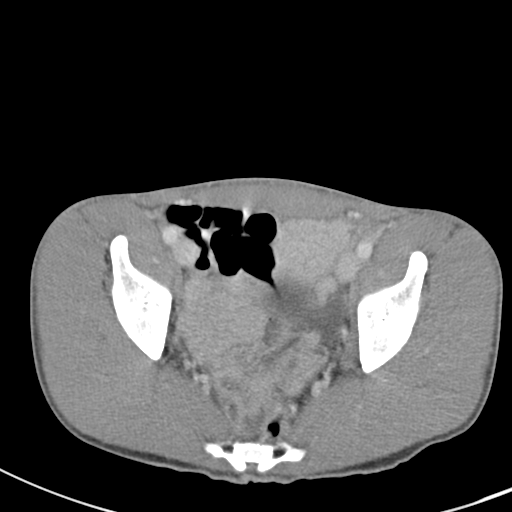
[im 22/78  soft-tissue]
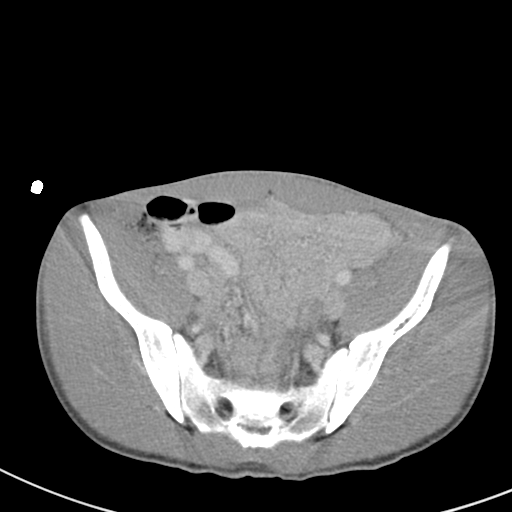
[im 28/78  soft-tissue]
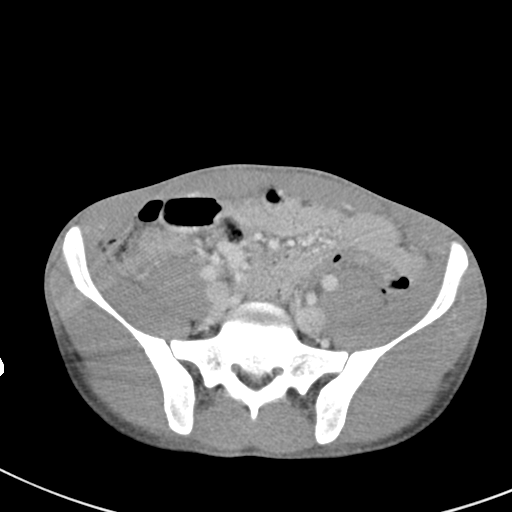
[im 34/78  soft-tissue]
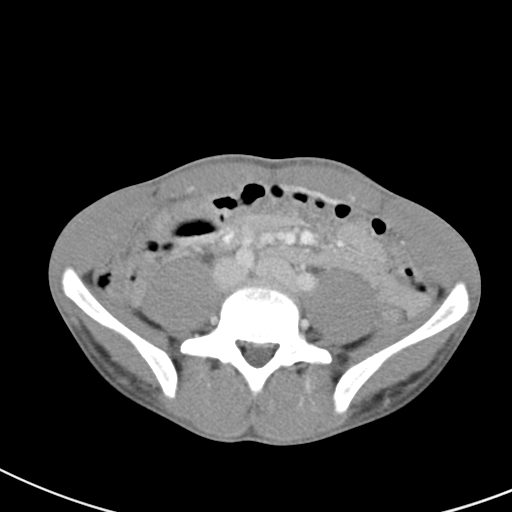
[im 41/78  soft-tissue]
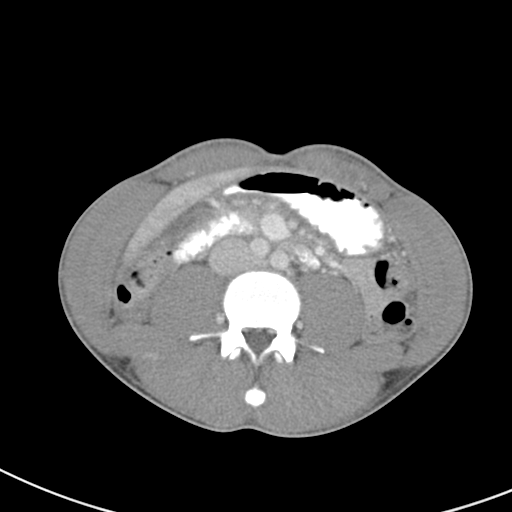
[im 44/78  soft-tissue]
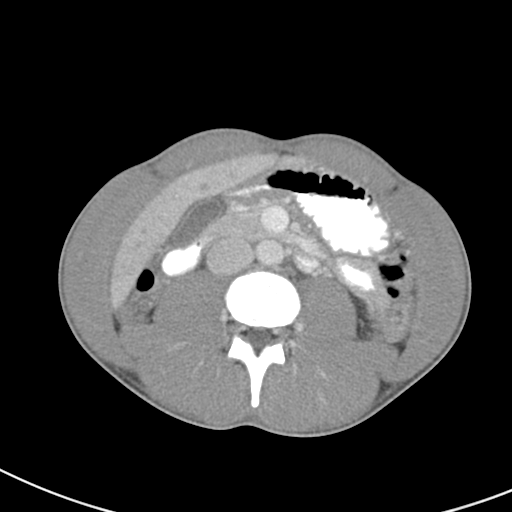
[im 50/78  soft-tissue]
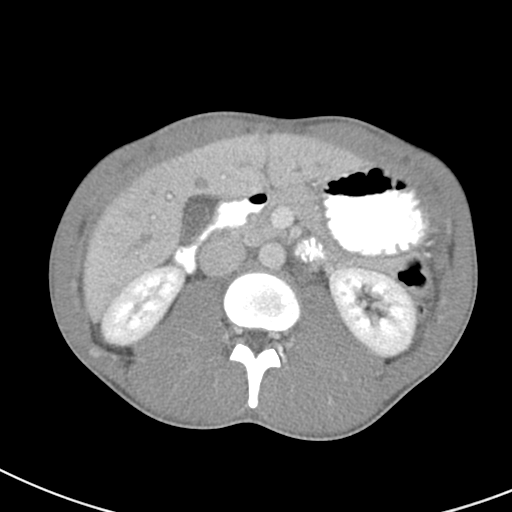
[im 50/78  bone]
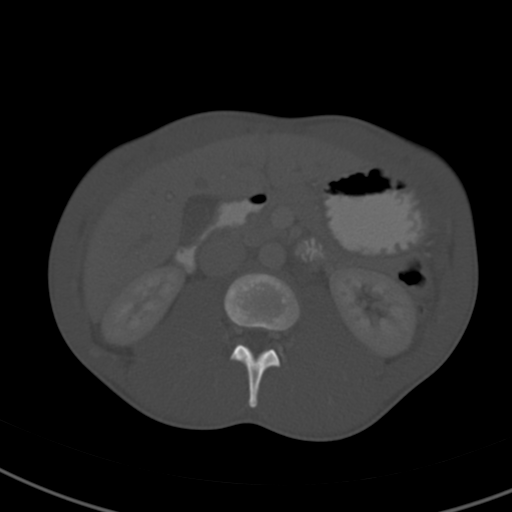
[im 56/78  soft-tissue]
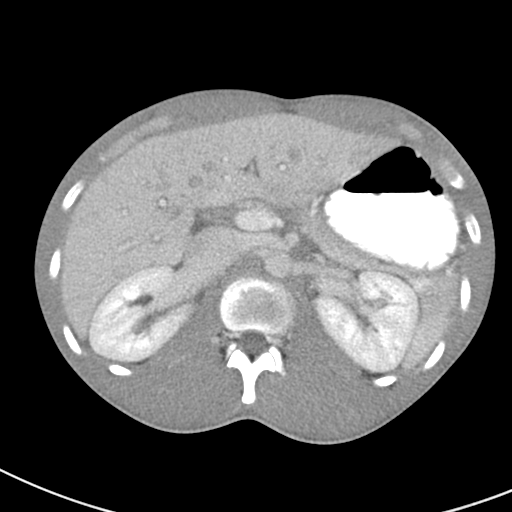
[im 62/78  soft-tissue]
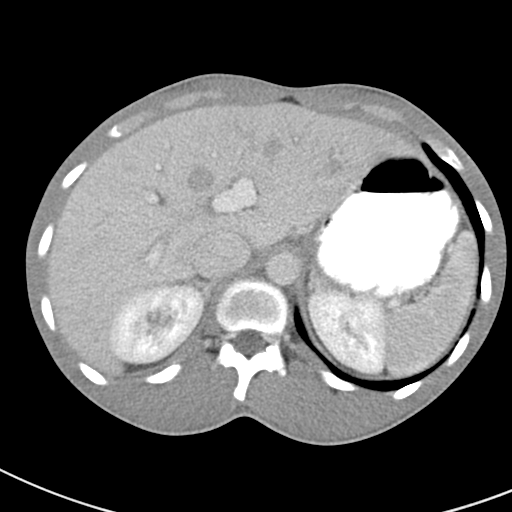
[im 65/78  lung]
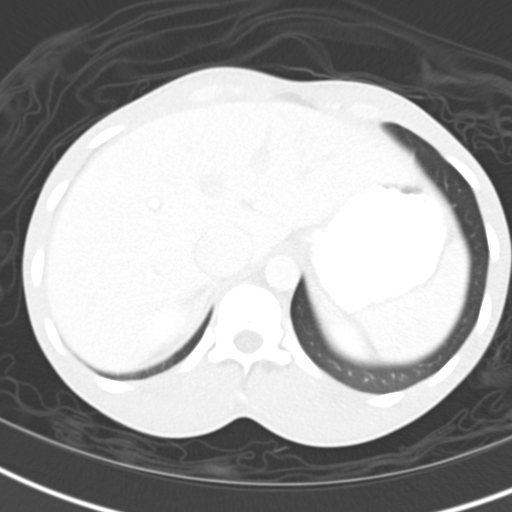
[im 68/78  soft-tissue]
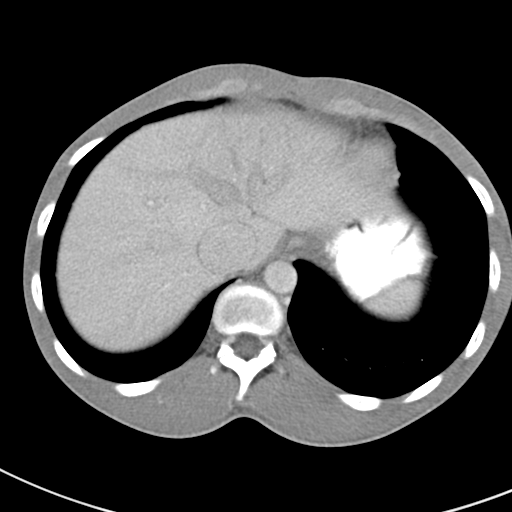
[im 68/78  lung]
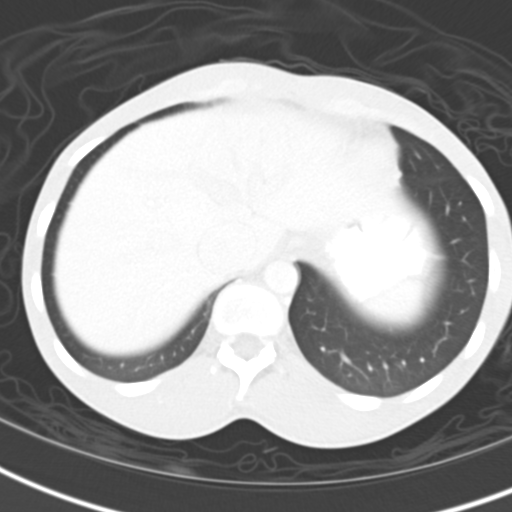
[im 71/78  lung]
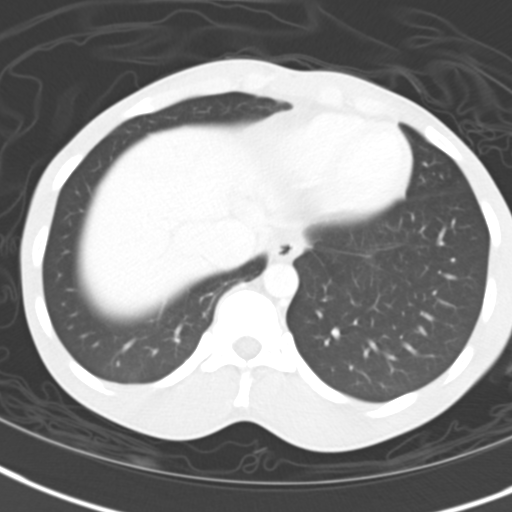
[im 74/78  soft-tissue]
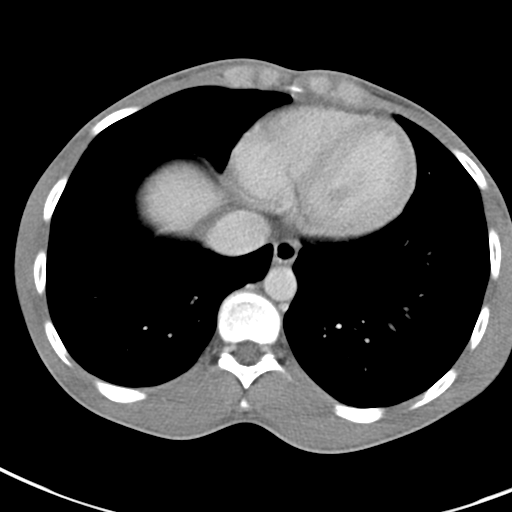
[im 74/78  lung]
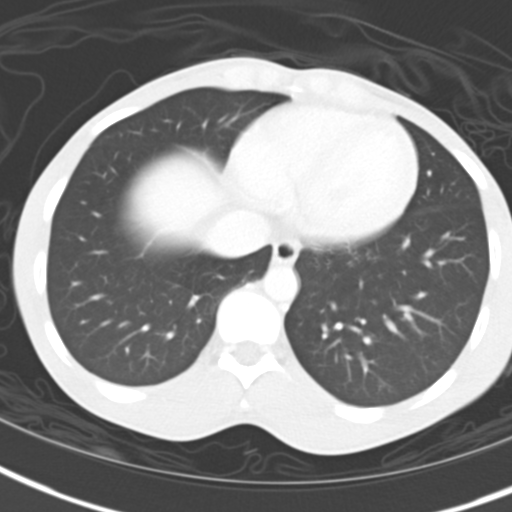

[15 of 32 positions shown; findings below may reference images not displayed]

FINDINGS: The visualized lung bases are clear.

Mild periportal edema is noted. Apparent gallbladder wall thickening
may reflect volume repletion, given the distention of the IVC. The
liver is otherwise unremarkable in appearance. The pancreas and
adrenal glands are unremarkable.

Scattered tiny hypodensities are seen within both kidneys, measuring
up to 5 mm in size; these likely reflect small cysts. There is no
evidence of hydronephrosis. No renal or ureteral stones are seen. No
perinephric stranding is appreciated.

Trace free fluid is seen within the pelvis. The small bowel is
unremarkable in appearance. The stomach is within normal limits. No
acute vascular abnormalities are seen.

The appendix is not definitely characterized, but there is no
evidence for appendicitis. The colon is difficult to fully
characterize due to the lack of surrounding fat, but appears grossly
unremarkable.

The bladder is mildly distended and grossly unremarkable. The
prostate remains normal in size. No inguinal lymphadenopathy is
seen.

No acute osseous abnormalities are identified.
IMPRESSION: 1. Evaluation limited due to the lack of intraperitoneal fat. The
appendix is not seen, but there is no definite evidence for
appendicitis.
2. Trace free fluid within the pelvis; this is nonspecific.
3. Apparent gallbladder wall thickening may simply reflect
aggressive rehydration, given distention of the IVC and associated
periportal edema. Would correlate for any associated findings.

## 2015-10-21 ENCOUNTER — Emergency Department (HOSPITAL_COMMUNITY)
Admission: EM | Admit: 2015-10-21 | Discharge: 2015-10-21 | Disposition: A | Discharge status: Home / Self Care | Payer: Self-pay | Attending: Emergency Medicine | Admitting: Emergency Medicine

## 2015-10-21 ENCOUNTER — Encounter (HOSPITAL_COMMUNITY): Payer: Self-pay | Admitting: Emergency Medicine

## 2015-10-21 DIAGNOSIS — R112 Nausea with vomiting, unspecified: Secondary | ICD-10-CM | POA: Insufficient documentation

## 2015-10-21 DIAGNOSIS — Z791 Long term (current) use of non-steroidal anti-inflammatories (NSAID): Secondary | ICD-10-CM | POA: Insufficient documentation

## 2015-10-21 DIAGNOSIS — F17210 Nicotine dependence, cigarettes, uncomplicated: Secondary | ICD-10-CM | POA: Insufficient documentation

## 2015-10-21 DIAGNOSIS — Z79899 Other long term (current) drug therapy: Secondary | ICD-10-CM | POA: Insufficient documentation

## 2015-10-21 DIAGNOSIS — J45909 Unspecified asthma, uncomplicated: Secondary | ICD-10-CM | POA: Insufficient documentation

## 2015-10-21 DIAGNOSIS — R42 Dizziness and giddiness: Secondary | ICD-10-CM | POA: Insufficient documentation

## 2015-10-21 DIAGNOSIS — R197 Diarrhea, unspecified: Secondary | ICD-10-CM | POA: Insufficient documentation

## 2015-10-21 LAB — COMPREHENSIVE METABOLIC PANEL
ALBUMIN: 4.9 g/dL (ref 3.5–5.0)
ALT: 20 U/L (ref 17–63)
AST: 33 U/L (ref 15–41)
Alkaline Phosphatase: 70 U/L (ref 38–126)
Anion gap: 13 (ref 5–15)
BUN: 14 mg/dL (ref 6–20)
CO2: 19 mmol/L — AB (ref 22–32)
CREATININE: 0.97 mg/dL (ref 0.61–1.24)
Calcium: 9.4 mg/dL (ref 8.9–10.3)
Chloride: 108 mmol/L (ref 101–111)
GFR calc Af Amer: >60 mL/min (ref >60)
GFR calc non Af Amer: >60 mL/min (ref >60)
GLUCOSE: 174 mg/dL — AB (ref 65–99)
Potassium: 3.5 mmol/L (ref 3.5–5.1)
SODIUM: 140 mmol/L (ref 135–145)
Total Bilirubin: 0.4 mg/dL (ref 0.3–1.2)
Total Protein: 8.6 g/dL — ABNORMAL HIGH (ref 6.5–8.1)

## 2015-10-21 LAB — RAPID URINE DRUG SCREEN, HOSP PERFORMED
AMPHETAMINES: NOT DETECTED
BARBITURATES: NOT DETECTED
Benzodiazepines: NOT DETECTED
Cocaine: NOT DETECTED
Opiates: NOT DETECTED
Tetrahydrocannabinol: POSITIVE — AB

## 2015-10-21 LAB — CBC WITH DIFFERENTIAL/PLATELET
Basophils Absolute: 0 10*3/uL (ref 0.0–0.1)
Basophils Relative: 0 %
EOS PCT: 0 %
Eosinophils Absolute: 0 10*3/uL (ref 0.0–0.7)
HCT: 38.6 % — ABNORMAL LOW (ref 39.0–52.0)
HEMOGLOBIN: 13.2 g/dL (ref 13.0–17.0)
LYMPHS ABS: 1.2 10*3/uL (ref 0.7–4.0)
LYMPHS PCT: 8 %
MCH: 31.4 pg (ref 26.0–34.0)
MCHC: 34.2 g/dL (ref 30.0–36.0)
MCV: 91.7 fL (ref 78.0–100.0)
Monocytes Absolute: 0.6 10*3/uL (ref 0.1–1.0)
Monocytes Relative: 4 %
Neutro Abs: 12.6 10*3/uL — ABNORMAL HIGH (ref 1.7–7.7)
Neutrophils Relative %: 88 %
PLATELETS: 243 10*3/uL (ref 150–400)
RBC: 4.21 MIL/uL — ABNORMAL LOW (ref 4.22–5.81)
RDW: 15 % (ref 11.5–15.5)
WBC: 14.4 10*3/uL — ABNORMAL HIGH (ref 4.0–10.5)

## 2015-10-21 LAB — URINALYSIS, ROUTINE W REFLEX MICROSCOPIC
Bilirubin Urine: NEGATIVE
GLUCOSE, UA: 100 mg/dL — AB
HGB URINE DIPSTICK: NEGATIVE
Ketones, ur: 40 mg/dL — AB
Leukocytes, UA: NEGATIVE
Nitrite: NEGATIVE
Protein, ur: NEGATIVE mg/dL
SPECIFIC GRAVITY, URINE: 1.025 (ref 1.005–1.030)
pH: 8.5 — ABNORMAL HIGH (ref 5.0–8.0)

## 2015-10-21 LAB — LIPASE, BLOOD: Lipase: 22 U/L (ref 11–51)

## 2015-10-21 MED ORDER — FENTANYL CITRATE (PF) 100 MCG/2ML IJ SOLN
50.0000 ug | Freq: Once | INTRAMUSCULAR | Status: AC
  Administered 2015-10-21: 50 ug via INTRAVENOUS
  Filled 2015-10-21: qty 2

## 2015-10-21 MED ORDER — SODIUM CHLORIDE 0.9 % IV BOLUS (SEPSIS)
1000.0000 mL | Freq: Once | INTRAVENOUS | Status: AC
  Administered 2015-10-21: 1000 mL via INTRAVENOUS

## 2015-10-21 MED ORDER — METOCLOPRAMIDE HCL 10 MG PO TABS: 10.0000 mg | ORAL_TABLET | Freq: Three times a day (TID) | ORAL | Status: AC | PRN

## 2015-10-21 MED ORDER — HALOPERIDOL LACTATE 5 MG/ML IJ SOLN
1.0000 mg | Freq: Once | INTRAMUSCULAR | Status: AC
  Administered 2015-10-21: 1 mg via INTRAVENOUS
  Filled 2015-10-21: qty 1

## 2015-10-21 MED ORDER — ONDANSETRON HCL 4 MG/2ML IJ SOLN: 4.0000 mg | Freq: Once | INTRAMUSCULAR | Status: DC

## 2015-10-21 MED ORDER — ONDANSETRON HCL 4 MG/2ML IJ SOLN: 4.0000 mg | INTRAMUSCULAR | Status: DC

## 2015-10-21 MED ORDER — ONDANSETRON 4 MG PO TBDP: 4.0000 mg | ORAL_TABLET | Freq: Three times a day (TID) | ORAL | Status: DC | PRN

## 2015-10-21 NOTE — Discharge Instructions (Signed)
Nausea and Vomiting °Nausea is a sick feeling that often comes before throwing up (vomiting). Vomiting is a reflex where stomach contents come out of your mouth. Vomiting can cause severe loss of body fluids (dehydration). Children and elderly adults can become dehydrated quickly, especially if they also have diarrhea. Nausea and vomiting are symptoms of a condition or disease. It is important to find the cause of your symptoms. °CAUSES  °· Direct irritation of the stomach lining. This irritation can result from increased acid production (gastroesophageal reflux disease), infection, food poisoning, taking certain medicines (such as nonsteroidal anti-inflammatory drugs), alcohol use, or tobacco use. °· Signals from the brain. These signals could be caused by a headache, heat exposure, an inner ear disturbance, increased pressure in the brain from injury, infection, a tumor, or a concussion, pain, emotional stimulus, or metabolic problems. °· An obstruction in the gastrointestinal tract (bowel obstruction). °· Illnesses such as diabetes, hepatitis, gallbladder problems, appendicitis, kidney problems, cancer, sepsis, atypical symptoms of a heart attack, or eating disorders. °· Medical treatments such as chemotherapy and radiation. °· Receiving medicine that makes you sleep (general anesthetic) during surgery. °DIAGNOSIS °Your caregiver may ask for tests to be done if the problems do not improve after a few days. Tests may also be done if symptoms are severe or if the reason for the nausea and vomiting is not clear. Tests may include: °· Urine tests. °· Blood tests. °· Stool tests. °· Cultures (to look for evidence of infection). °· X-rays or other imaging studies. °Test results can help your caregiver make decisions about treatment or the need for additional tests. °TREATMENT °You need to stay well hydrated. Drink frequently but in small amounts. You may wish to drink water, sports drinks, clear broth, or eat frozen  ice pops or gelatin dessert to help stay hydrated. When you eat, eating slowly may help prevent nausea. There are also some antinausea medicines that may help prevent nausea. °HOME CARE INSTRUCTIONS  °· Take all medicine as directed by your caregiver. °· If you do not have an appetite, do not force yourself to eat. However, you must continue to drink fluids. °· If you have an appetite, eat a normal diet unless your caregiver tells you differently. °· Eat a variety of complex carbohydrates (rice, wheat, potatoes, bread), lean meats, yogurt, fruits, and vegetables. °· Avoid high-fat foods because they are more difficult to digest. °· Drink enough water and fluids to keep your urine clear or pale yellow. °· If you are dehydrated, ask your caregiver for specific rehydration instructions. Signs of dehydration may include: °· Severe thirst. °· Dry lips and mouth. °· Dizziness. °· Dark urine. °· Decreasing urine frequency and amount. °· Confusion. °· Rapid breathing or pulse. °SEEK IMMEDIATE MEDICAL CARE IF:  °· You have blood or brown flecks (like coffee grounds) in your vomit. °· You have black or bloody stools. °· You have a severe headache or stiff neck. °· You are confused. °· You have severe abdominal pain. °· You have chest pain or trouble breathing. °· You do not urinate at least once every 8 hours. °· You develop cold or clammy skin. °· You continue to vomit for longer than 24 to 48 hours. °· You have a fever. °MAKE SURE YOU:  °· Understand these instructions. °· Will watch your condition. °· Will get help right away if you are not doing well or get worse. °  °This information is not intended to replace advice given to you by your health care provider. Make sure   you discuss any questions you have with your health care provider. °  °Document Released: 06/15/2005 Document Revised: 09/07/2011 Document Reviewed: 11/12/2010 °Elsevier Interactive Patient Education ©2016 [employer] ° °Abdominal Pain, Adult °Many  things can cause abdominal pain. Usually, abdominal pain is not caused by a disease and will improve without treatment. It can often be observed and treated at home. Your health care provider will do a physical exam and possibly order blood tests and X-rays to help determine the seriousness of your pain. However, in many cases, more time must pass before a clear cause of the pain can be found. Before that point, your health care provider may not know if you need more testing or further treatment. °HOME CARE INSTRUCTIONS °Monitor your abdominal pain for any changes. The following actions may help to alleviate any discomfort you are experiencing: °· Only take over-the-counter or prescription medicines as directed by your health care provider. °· Do not take laxatives unless directed to do so by your health care provider. °· Try a clear liquid diet (broth, tea, or water) as directed by your health care provider. Slowly move to a bland diet as tolerated. °SEEK MEDICAL CARE IF: °· You have unexplained abdominal pain. °· You have abdominal pain associated with nausea or diarrhea. °· You have pain when you urinate or have a bowel movement. °· You experience abdominal pain that wakes you in the night. °· You have abdominal pain that is worsened or improved by eating food. °· You have abdominal pain that is worsened with eating fatty foods. °· You have a fever. °SEEK IMMEDIATE MEDICAL CARE IF: °· Your pain does not go away within 2 hours. °· You keep throwing up (vomiting). °· Your pain is felt only in portions of the abdomen, such as the right side or the left lower portion of the abdomen. °· You pass bloody or black tarry stools. °MAKE SURE YOU: °· Understand these instructions. °· Will watch your condition. °· Will get help right away if you are not doing well or get worse. °  °This information is not intended to replace advice given to you by your health care provider. Make sure you discuss any questions you have with  your health care provider. °  °Document Released: 03/25/2005 Document Revised: 03/06/2015 Document Reviewed: 02/22/2013 °Elsevier Interactive Patient Education ©2016 [employer] ° °

## 2015-10-21 NOTE — ED Notes (Signed)
Pt c/o emesis continuously since 2100 last evening. Lips chapped, unable to get temp d/t heaving.

## 2015-10-21 NOTE — ED Provider Notes (Signed)
CSN: [id number]     Arrival date & time 10/21/15  7829 History   None    Chief Complaint  Patient presents with  . Emesis     (Consider location/radiation/quality/duration/timing/severity/associated sxs/prior Treatment) HPI Comments: Pt is a 25 y/o male, current daily tobacco and marijuana user, presents to the ER with one day of rapidly worsening generalized abdominal pain, w/o radiation, rated 10/10, associated with severe constant vomiting, reported 20-30 episodes in the past 24 hours, with diarrhea.  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  He currently does not have any antiemetic rx.  He denies hematemesis, melena, hematochezia, sick contacts, suspicious foods intake, change to meds or source of illegal drugs.  He complains of dry heaving, dehydration, and generalized weakness.  He denies CP, SOB, flank pain, dysuria, hematuria, HA, rash, fever, syncope.  Patient is a 25 y.o. male presenting with vomiting. The history is provided by the patient.  Emesis Severity:  Severe Duration:  1 day Timing:  Constant Number of daily episodes:  20-30 Quality:  Stomach contents and undigested food (denies blood in emesis, non-bilious) Able to tolerate:  Solids and liquids Progression:  Worsening Chronicity:  Recurrent Recent urination:  Decreased Context: not post-tussive and not self-induced   Context comment:  [place] use, hx of cyclic vomiting Relieved by:  Nothing Worsened by:  Nothing tried Ineffective treatments:  None tried Associated symptoms: abdominal pain and diarrhea   Associated symptoms: no chills, no cough, no fever, no headaches, no myalgias, no sore throat and no URI   Abdominal pain:    Location:  Generalized   Quality:  Cramping, aching, sharp and shooting   Severity:  Severe (10/10)   Onset quality:  Gradual   Duration:  1 day   Timing:  Constant   Progression:  Worsening   Chronicity:   Recurrent Risk factors: no prior abdominal surgery, no sick contacts, no suspect food intake and no travel to endemic areas     Past Medical History  Diagnosis Date  . Asthma    History reviewed. No pertinent past surgical history. No family history on file. Social History  Substance Use Topics  . Smoking status: Current Every Day Smoker -- 1.00 packs/day    Types: Cigarettes  . Smokeless tobacco: Never Used  . Alcohol Use: Yes     Comment: occasionally    Review of Systems  Constitutional: Negative.  Negative for fever, chills, diaphoresis, activity change, appetite change and fatigue.  HENT: Negative.  Negative for sore throat.   Eyes: Negative.   Respiratory: Negative.   Cardiovascular: Negative.   Gastrointestinal: Positive for nausea, vomiting, abdominal pain and diarrhea. Negative for constipation, blood in stool, abdominal distention, anal bleeding and rectal pain.  Endocrine: Negative.   Genitourinary: Negative.   Musculoskeletal: Negative.  Negative for myalgias.  Skin: Negative.  Negative for color change, pallor and rash.  Neurological: Positive for light-headedness. Negative for dizziness, tremors, syncope, weakness, numbness and headaches.  Hematological: Negative.   Psychiatric/Behavioral: Negative.   All other systems reviewed and are negative.     Allergies  Review of patient's allergies indicates no known allergies.  Home Medications   Prior to Admission medications   Medication Sig Start Date End Date Taking? Authorizing Provider  ibuprofen (ADVIL,MOTRIN) 200 MG tablet Take 800 mg by mouth every 4 (four) hours as needed for fever, headache, mild pain, moderate pain or cramping.   Yes  Historical Provider, MD  metoCLOPramide (REGLAN) 10 MG tablet Take 1 tablet (10 mg total) by mouth 3 (three) times daily as needed for nausea (headache / nausea). 10/21/15   Danelle BerryLeisa Dondra Rhett, PA-C  ondansetron (ZOFRAN ODT) 4 MG disintegrating tablet Take 1 tablet (4 mg total) by  mouth every 8 (eight) hours as needed for nausea or vomiting. 10/21/15   Danelle BerryLeisa Sadrac Zeoli, PA-C   BP 116/69 mmHg  Pulse 81  Temp(Src) 97.8 F (36.6 C)  Resp 20  Ht 6' (1.829 m)  SpO2 85% Physical Exam  Constitutional: He is oriented to person, place, and time. He appears well-developed and well-nourished. No distress.  Extremely thin male, alert, anxious, unable to hold still, rocking himself in ER gurney, then jogging through the ER hallways  HENT:  Head: Normocephalic and atraumatic.  Nose: Nose normal.  Mouth/Throat: Oropharynx is clear and moist. No oropharyngeal exudate.  Eyes: Conjunctivae and EOM are normal. Pupils are equal, round, and reactive to light. Right eye exhibits no discharge. Left eye exhibits no discharge. No scleral icterus.  Neck: Normal range of motion. No JVD present. No tracheal deviation present. No thyromegaly present.  Cardiovascular: Normal rate, regular rhythm, normal heart sounds and intact distal pulses.  Exam reveals no gallop and no friction rub.   No murmur heard. Pulmonary/Chest: Effort normal and breath sounds normal. No respiratory distress. He has no decreased breath sounds. He has no wheezes. He has no rhonchi. He has no rales. He exhibits no tenderness.  Abdominal: Soft. Normal appearance and bowel sounds are normal. He exhibits no distension and no mass. There is generalized tenderness. There is no rigidity, no rebound, no guarding and no CVA tenderness.  Musculoskeletal: Normal range of motion. He exhibits no edema or tenderness.  Lymphadenopathy:    He has no cervical adenopathy.  Neurological: He is alert and oriented to person, place, and time. He has normal strength and normal reflexes. He displays no tremor. No cranial nerve deficit or sensory deficit. He exhibits normal muscle tone. He displays no seizure activity. Coordination and gait normal.  Skin: Skin is warm and dry. No rash noted. He is not diaphoretic. No cyanosis or erythema. No pallor.  Nails show no clubbing.  Psychiatric: Judgment and thought content normal. His mood appears anxious. He is hyperactive. Cognition and memory are normal.  Nursing note and vitals reviewed.   ED Course  Procedures (including critical care time) Labs Review Labs Reviewed  COMPREHENSIVE METABOLIC PANEL - Abnormal; Notable for the following:    CO2 19 (*)    Glucose, Bld 174 (*)    Total Protein 8.6 (*)    All other components within normal limits  CBC WITH DIFFERENTIAL/PLATELET - Abnormal; Notable for the following:    WBC 14.4 (*)    RBC 4.21 (*)    HCT 38.6 (*)    Neutro Abs 12.6 (*)    All other components within normal limits  URINALYSIS, ROUTINE W REFLEX MICROSCOPIC (NOT AT [facility]) - Abnormal; Notable for the following:    pH 8.5 (*)    Glucose, UA 100 (*)    Ketones, ur 40 (*)    All other components within normal limits  URINE RAPID DRUG SCREEN, HOSP PERFORMED - Abnormal; Notable for the following:    Tetrahydrocannabinol POSITIVE (*)    All other components within normal limits  LIPASE, BLOOD    Imaging Review No results found. I have personally reviewed and evaluated these images and lab results as part of my  medical decision-making.   EKG Interpretation None      MDM   25 y/o male presents with N, V, D and generalized abdominal pain. Pt is extremely thin, has hx of similar presentations with pmhx of gastroenteritis, cyclic vomiting, marijuana abuse.  He reports continued marijuana use.  He appears uncomfortable, and somewhat aggitated by pain, however he is alert, able to provide history, vital signs stable (although oral temp unable to be obtained, requested rectal temp, though I suspect pt will refuse, and pt does not feel febrile to the touch).  Patient appears nontoxic, was asked multiple times to return to his room when found jogging around the ER which he states improves his pain.  He was cooperative.  CMP, lipase, CBC, UA + drug screen ordered, will give IVF,  pain medication, haldol for severe N/V with history of cyclic vomiting and cannabis abuse.  No focal abdominal tenderness on exam, abdomen soft, non-distended, do not suspect acute abdomen.  Did not feel any imaging is indicated at this time.  Patient improved with IV fluids and medication.  He appeared relaxed, he didn't have any further emesis while in the ER and tolerated by mouth trial without difficulty.  Labwork was significant for positive THC in urine with glucosuria and 40 ketones, no evidence of UTI.  Upper significant for mildly decreased CO2, 19, leukocytosis of 14.4, mildly elevated hematocrit.    The patient's abdomen was reexamined, with complete resolution of his tenderness. Feel he is safe to discharge home with antiemetics, no evidence or concern for emergent medication condition.  Patient was discharged in good condition with stable vital signs.  (There is notation and ED vital signs of patient being placed on 2 L of oxygen with discharge vitals oxygenation low at 85%, patient was never on nasal cannula oxygen while in the ER or at any time during my evaluations and repeated evaluations.  He had no respiratory complaints including no shortness of breath, chest tightness, and exam lungs are clear to auscultation without wheezes, rales or rhonchi, no cyanosis.  Pt was discharged at 0941, noted by ED staff to have 0/10 abdominal pain, no other notation during discharge, and no report to me of decreased SpO2, feel 08:30 SpO2 reading may be an error with pulseox, poor waveform, and or this may be a clerical error as departure condition is documented as "stable" and "ambulatory")  Final diagnoses:  Non-intractable vomiting with nausea, vomiting of unspecified type        Danelle Berry, PA-C 10/22/15 1942  Alvira Monday, MD 10/22/15 2351

## 2016-07-21 ENCOUNTER — Emergency Department (HOSPITAL_COMMUNITY)
Admission: EM | Admit: 2016-07-21 | Discharge: 2016-07-21 | Disposition: A | Discharge status: Home / Self Care | Payer: Self-pay | Attending: Emergency Medicine | Admitting: Emergency Medicine

## 2016-07-21 ENCOUNTER — Encounter (HOSPITAL_COMMUNITY): Payer: Self-pay | Admitting: Emergency Medicine

## 2016-07-21 DIAGNOSIS — Z79899 Other long term (current) drug therapy: Secondary | ICD-10-CM | POA: Insufficient documentation

## 2016-07-21 DIAGNOSIS — F17210 Nicotine dependence, cigarettes, uncomplicated: Secondary | ICD-10-CM | POA: Insufficient documentation

## 2016-07-21 DIAGNOSIS — J45909 Unspecified asthma, uncomplicated: Secondary | ICD-10-CM | POA: Insufficient documentation

## 2016-07-21 DIAGNOSIS — R112 Nausea with vomiting, unspecified: Secondary | ICD-10-CM | POA: Insufficient documentation

## 2016-07-21 LAB — CBC
HCT: 40.2 % (ref 39.0–52.0)
HEMOGLOBIN: 14.2 g/dL (ref 13.0–17.0)
MCH: 31.6 pg (ref 26.0–34.0)
MCHC: 35.3 g/dL (ref 30.0–36.0)
MCV: 89.5 fL (ref 78.0–100.0)
Platelets: 246 10*3/uL (ref 150–400)
RBC: 4.49 MIL/uL (ref 4.22–5.81)
RDW: 13.4 % (ref 11.5–15.5)
WBC: 18.3 10*3/uL — AB (ref 4.0–10.5)

## 2016-07-21 LAB — COMPREHENSIVE METABOLIC PANEL
ALK PHOS: 87 U/L (ref 38–126)
ALT: 17 U/L (ref 17–63)
ANION GAP: 12 (ref 5–15)
AST: 25 U/L (ref 15–41)
Albumin: 4.6 g/dL (ref 3.5–5.0)
BUN: 9 mg/dL (ref 6–20)
CALCIUM: 9.1 mg/dL (ref 8.9–10.3)
CO2: 19 mmol/L — AB (ref 22–32)
CREATININE: 0.91 mg/dL (ref 0.61–1.24)
Chloride: 107 mmol/L (ref 101–111)
Glucose, Bld: 161 mg/dL — ABNORMAL HIGH (ref 65–99)
Potassium: 3.5 mmol/L (ref 3.5–5.1)
SODIUM: 138 mmol/L (ref 135–145)
TOTAL PROTEIN: 8.4 g/dL — AB (ref 6.5–8.1)
Total Bilirubin: 0.2 mg/dL — ABNORMAL LOW (ref 0.3–1.2)

## 2016-07-21 LAB — LIPASE, BLOOD: LIPASE: 24 U/L (ref 11–51)

## 2016-07-21 MED ORDER — DIPHENHYDRAMINE HCL 50 MG/ML IJ SOLN
25.0000 mg | Freq: Once | INTRAMUSCULAR | Status: DC
  Filled 2016-07-21: qty 1

## 2016-07-21 MED ORDER — FAMOTIDINE IN NACL 20-0.9 MG/50ML-% IV SOLN
20.0000 mg | Freq: Once | INTRAVENOUS | Status: AC
  Administered 2016-07-21: 20 mg via INTRAVENOUS
  Filled 2016-07-21: qty 50

## 2016-07-21 MED ORDER — HALOPERIDOL LACTATE 5 MG/ML IJ SOLN
5.0000 mg | Freq: Once | INTRAMUSCULAR | Status: AC
  Administered 2016-07-21: 5 mg via INTRAVENOUS
  Filled 2016-07-21: qty 1

## 2016-07-21 MED ORDER — DIPHENHYDRAMINE HCL 50 MG/ML IJ SOLN
50.0000 mg | Freq: Once | INTRAMUSCULAR | Status: AC
  Administered 2016-07-21: 50 mg via INTRAVENOUS

## 2016-07-21 NOTE — ED Provider Notes (Signed)
[facility]-EMERGENCY DEPT Provider Note   CSN: [id number] Arrival date & time: 07/21/16  0003 By signing my name below, I, Bridgette HabermannMaria Tan, attest that this documentation has been prepared under the direction and in the presence of Tomasita CrumbleAdeleke Apryle Stowell, MD. Electronically Signed: Bridgette HabermannMaria Tan, ED Scribe. 07/21/16. 3:41 AM.  History   Chief Complaint Chief Complaint  Patient presents with  . Abdominal Pain  . Emesis  . Diarrhea    HPI The history is provided by the patient. No language interpreter was used.   HPI Comments: Robert Bryant is a 26 y.o. male with h/o asthma, who presents to the Emergency Department by EMS complaining of episodic vomiting onset last night ~8pm with associated nausea, diarrhea, and abdominal pain. Pt states "it's a stomach virus". His symptoms seem to be improved when he lays in a curled position. He states he has been seen several times for these symptoms prior and is currently requesting morphine. He has not tried any OTC medications PTA. Denies suspicious foot intake. Denies fever, chills.  Past Medical History:  Diagnosis Date  . Asthma     There are no active problems to display for this patient.   History reviewed. No pertinent surgical history.     Home Medications    Prior to Admission medications   Medication Sig Start Date End Date Taking? Authorizing Provider  ibuprofen (ADVIL,MOTRIN) 200 MG tablet Take 800 mg by mouth every 4 (four) hours as needed for fever, headache, mild pain, moderate pain or cramping.    Historical Provider, MD  metoCLOPramide (REGLAN) 10 MG tablet Take 1 tablet (10 mg total) by mouth 3 (three) times daily as needed for nausea (headache / nausea). 10/21/15   Danelle BerryLeisa Tapia, PA-C  ondansetron (ZOFRAN ODT) 4 MG disintegrating tablet Take 1 tablet (4 mg total) by mouth every 8 (eight) hours as needed for nausea or vomiting. 10/21/15   Danelle BerryLeisa Tapia, PA-C    Family History History reviewed. No pertinent family history.  Social  History Social History  Substance Use Topics  . Smoking status: Current Every Day Smoker    Packs/day: 1.00    Types: Cigarettes  . Smokeless tobacco: Never Used  . Alcohol use Yes     Comment: occasionally     Allergies   Patient has no known allergies.   Review of Systems Review of Systems  Constitutional: Negative for chills and fever.  Gastrointestinal: Positive for abdominal pain, diarrhea, nausea and vomiting.  All other systems reviewed and are negative.    Physical Exam Updated Vital Signs BP 129/83 (BP Location: Left Arm)   Pulse 91   Temp 97.4 F (36.3 C) (Oral)   Resp 18   Ht 5\' 9"  (1.753 m)   Wt 120 lb (54.4 kg)   SpO2 100%   BMI 17.72 kg/m   Physical Exam  Constitutional: He is oriented to person, place, and time. Vital signs are normal. He appears well-developed and well-nourished.  Non-toxic appearance. He does not appear ill. No distress.  HENT:  Head: Normocephalic and atraumatic.  Nose: Nose normal.  Mouth/Throat: Oropharynx is clear and moist. No oropharyngeal exudate.  Eyes: Conjunctivae and EOM are normal. Pupils are equal, round, and reactive to light. No scleral icterus.  Neck: Normal range of motion. Neck supple. No tracheal deviation, no edema, no erythema and normal range of motion present. No thyroid mass and no thyromegaly present.  Cardiovascular: Normal rate, regular rhythm, S1 normal, S2 normal, normal heart sounds, intact distal pulses and normal  pulses.  Exam reveals no gallop and no friction rub.   No murmur heard. Pulmonary/Chest: Effort normal and breath sounds normal. No respiratory distress. He has no wheezes. He has no rhonchi. He has no rales.  Abdominal: Soft. Normal appearance and bowel sounds are normal. He exhibits no distension, no ascites and no mass. There is no hepatosplenomegaly. There is no tenderness. There is no rebound, no guarding and no CVA tenderness.  Musculoskeletal: Normal range of motion. He exhibits no edema  or tenderness.  Lymphadenopathy:    He has no cervical adenopathy.  Neurological: He is alert and oriented to person, place, and time. He has normal strength. No cranial nerve deficit or sensory deficit.  Skin: Skin is warm, dry and intact. No petechiae and no rash noted. He is not diaphoretic. No erythema. No pallor.  Nursing note and vitals reviewed.  ED Treatments / Results  DIAGNOSTIC STUDIES: Oxygen Saturation is 100% on RA, normal by my interpretation.    COORDINATION OF CARE: 3:41 AM Discussed treatment plan with pt at bedside and pt agreed to plan.  Labs (all labs ordered are listed, but only abnormal results are displayed) Labs Reviewed  COMPREHENSIVE METABOLIC PANEL - Abnormal; Notable for the following:       Result Value   CO2 19 (*)    Glucose, Bld 161 (*)    Total Protein 8.4 (*)    Total Bilirubin 0.2 (*)    All other components within normal limits  CBC - Abnormal; Notable for the following:    WBC 18.3 (*)    All other components within normal limits  LIPASE, BLOOD  URINALYSIS, ROUTINE W REFLEX MICROSCOPIC    EKG  EKG Interpretation None       Radiology No results found.  Procedures Procedures (including critical care time)  Medications Ordered in ED Medications - No data to display   Initial Impression / Assessment and Plan / ED Course  I have reviewed the triage vital signs and the nursing notes.  Pertinent labs & imaging results that were available during my care of the patient were reviewed by me and considered in my medical decision making (see chart for details).     Patient presents to the ED for N/V.  He can not tell me what causes this and is requesting morphine.  Patient advised this will not help with nausea and may make it worse.  He was given haldol and benadryl as this helped before in the past.  Upon repeat evaluation, His vomiting has subsided.  He appears well and in NAD.  PCP fu advised within 3 days.  VS remain within his  normal limits and he is safe for Dc.     Final Clinical Impressions(s) / ED Diagnoses   Final diagnoses:  None    New Prescriptions New Prescriptions   No medications on file   I personally performed the services described in this documentation, which was scribed in my presence. The recorded information has been reviewed and is accurate.       Tomasita Crumble, MD 07/21/16 [phone]

## 2016-07-21 NOTE — ED Triage Notes (Signed)
PT brought in by EMS with c/o nausea, vomiting, and diarrhea that started around 8pm this evening  Pt is also c/o abd pain

## 2016-07-21 NOTE — ED Notes (Signed)
Pt approached this recorder in the lobby and states he is now passing blood in his "boo boo"

## 2017-03-29 ENCOUNTER — Encounter (HOSPITAL_COMMUNITY): Payer: Self-pay | Admitting: Emergency Medicine

## 2017-03-29 ENCOUNTER — Emergency Department (HOSPITAL_COMMUNITY): Payer: Self-pay

## 2017-03-29 ENCOUNTER — Emergency Department (HOSPITAL_COMMUNITY)
Admission: EM | Admit: 2017-03-29 | Discharge: 2017-03-29 | Disposition: A | Discharge status: Home / Self Care | Payer: Self-pay | Attending: Emergency Medicine | Admitting: Emergency Medicine

## 2017-03-29 DIAGNOSIS — F17210 Nicotine dependence, cigarettes, uncomplicated: Secondary | ICD-10-CM | POA: Insufficient documentation

## 2017-03-29 DIAGNOSIS — R112 Nausea with vomiting, unspecified: Secondary | ICD-10-CM | POA: Insufficient documentation

## 2017-03-29 DIAGNOSIS — R197 Diarrhea, unspecified: Secondary | ICD-10-CM | POA: Insufficient documentation

## 2017-03-29 DIAGNOSIS — J45909 Unspecified asthma, uncomplicated: Secondary | ICD-10-CM | POA: Insufficient documentation

## 2017-03-29 LAB — URINALYSIS, ROUTINE W REFLEX MICROSCOPIC
Bacteria, UA: NONE SEEN
Bilirubin Urine: NEGATIVE
GLUCOSE, UA: NEGATIVE mg/dL
Ketones, ur: 80 mg/dL — AB
Leukocytes, UA: NEGATIVE
NITRITE: NEGATIVE
Protein, ur: 100 mg/dL — AB
SPECIFIC GRAVITY, URINE: 1.033 — AB (ref 1.005–1.030)
pH: 6 (ref 5.0–8.0)

## 2017-03-29 LAB — COMPREHENSIVE METABOLIC PANEL
ALBUMIN: 4.4 g/dL (ref 3.5–5.0)
ALT: 18 U/L (ref 17–63)
ANION GAP: 12 (ref 5–15)
AST: 21 U/L (ref 15–41)
Alkaline Phosphatase: 84 U/L (ref 38–126)
BUN: 18 mg/dL (ref 6–20)
CO2: 26 mmol/L (ref 22–32)
CREATININE: 1.03 mg/dL (ref 0.61–1.24)
Calcium: 9.4 mg/dL (ref 8.9–10.3)
Chloride: 99 mmol/L — ABNORMAL LOW (ref 101–111)
GFR calc Af Amer: >60 mL/min (ref >60)
GFR calc non Af Amer: >60 mL/min (ref >60)
GLUCOSE: 82 mg/dL (ref 65–99)
Potassium: 3.4 mmol/L — ABNORMAL LOW (ref 3.5–5.1)
Sodium: 137 mmol/L (ref 135–145)
TOTAL PROTEIN: 7.8 g/dL (ref 6.5–8.1)
Total Bilirubin: 0.9 mg/dL (ref 0.3–1.2)

## 2017-03-29 LAB — CBC
HCT: 46.9 % (ref 39.0–52.0)
HEMOGLOBIN: 16.2 g/dL (ref 13.0–17.0)
MCH: 31.8 pg (ref 26.0–34.0)
MCHC: 34.5 g/dL (ref 30.0–36.0)
MCV: 92.1 fL (ref 78.0–100.0)
PLATELETS: 260 10*3/uL (ref 150–400)
RBC: 5.09 MIL/uL (ref 4.22–5.81)
RDW: 13.7 % (ref 11.5–15.5)
WBC: 9.9 10*3/uL (ref 4.0–10.5)

## 2017-03-29 LAB — LIPASE, BLOOD: LIPASE: 24 U/L (ref 11–51)

## 2017-03-29 MED ORDER — ONDANSETRON HCL 4 MG/2ML IJ SOLN
4.0000 mg | Freq: Once | INTRAMUSCULAR | Status: AC
  Administered 2017-03-29: 4 mg via INTRAVENOUS
  Filled 2017-03-29: qty 2

## 2017-03-29 MED ORDER — ONDANSETRON 4 MG PO TBDP: 4.0000 mg | ORAL_TABLET | Freq: Three times a day (TID) | ORAL | 0 refills | Status: AC | PRN

## 2017-03-29 MED ORDER — SODIUM CHLORIDE 0.9 % IV BOLUS (SEPSIS)
1000.0000 mL | Freq: Once | INTRAVENOUS | Status: AC
  Administered 2017-03-29: 1000 mL via INTRAVENOUS

## 2017-03-29 MED ORDER — ONDANSETRON 4 MG PO TBDP
ORAL_TABLET | ORAL | Status: AC
  Filled 2017-03-29: qty 1

## 2017-03-29 MED ORDER — HALOPERIDOL LACTATE 5 MG/ML IJ SOLN
5.0000 mg | Freq: Once | INTRAMUSCULAR | Status: AC
  Administered 2017-03-29: 5 mg via INTRAVENOUS
  Filled 2017-03-29: qty 1

## 2017-03-29 MED ORDER — ONDANSETRON 4 MG PO TBDP
4.0000 mg | ORAL_TABLET | Freq: Once | ORAL | Status: AC | PRN
  Administered 2017-03-29: 4 mg via ORAL

## 2017-03-29 NOTE — ED Provider Notes (Signed)
[facility]-EMERGENCY DEPT Provider Note   CSN: [id number] Arrival date & time: 03/29/17  1036     History   Chief Complaint Chief Complaint  Patient presents with  . Abdominal Pain    HPI Robert Bryant is a 26 y.o. male with history of tobacco abuse and marijuana abuse presents to ED for evaluation of sudden onset of nausea, nonbilious nonbloody emesis, nonbloody diarrhea 3 days. Associated symptoms include chills and diffuse abdominal pain. Symptoms have been persistent. No alleviating or aggravating factors. States he has vomited more than 100 times in the last 3 days, states he has not eaten or drank anything in the last 3 days. Endorses heavy marijuana use, multiple times a day every day. Occasional alcohol use, last used 3 days ago before symptom onset. Denies exposure to suspicious foods, sick contacts, recent travel. No fevers, chest pain, shortness of breath, dysuria, hematuria, penile discharge, melena, hematochezia. Chart review reveals patient's been to the ED for cyclical nausea, vomiting, abdominal pain and diarrhea in the past thought to be induced from marijuana use.  HPI  Past Medical History:  Diagnosis Date  . Asthma     There are no active problems to display for this patient.   History reviewed. No pertinent surgical history.     Home Medications    Prior to Admission medications   Medication Sig Start Date End Date Taking? Authorizing Provider  ibuprofen (ADVIL,MOTRIN) 200 MG tablet Take 800 mg by mouth every 4 (four) hours as needed for fever, headache, mild pain, moderate pain or cramping.    [provider]  metoCLOPramide (REGLAN) 10 MG tablet Take 1 tablet (10 mg total) by mouth 3 (three) times daily as needed for nausea (headache / nausea). Patient not taking: Reported on 07/21/2016 10/21/15   Danelle Berry, PA-C  ondansetron (ZOFRAN ODT) 4 MG disintegrating tablet Take 1 tablet (4 mg total) by mouth every 8 (eight) hours as needed for nausea or  vomiting. 03/29/17   Liberty Handy, PA-C    Family History History reviewed. No pertinent family history.  Social History Social History  Substance Use Topics  . Smoking status: Current Every Day Smoker    Packs/day: 1.00    Types: Cigarettes  . Smokeless tobacco: Never Used  . Alcohol use Yes     Comment: occasionally     Allergies   Patient has no known allergies.   Review of Systems Review of Systems  Constitutional: Positive for appetite change and chills. Negative for fever.  Respiratory: Negative for shortness of breath.   Cardiovascular: Negative for chest pain.  Gastrointestinal: Positive for abdominal pain, diarrhea, nausea and vomiting. Negative for anal bleeding and blood in stool.  Genitourinary: Negative for difficulty urinating, dysuria and hematuria.  Musculoskeletal: Negative for back pain.  Allergic/Immunologic: Negative for immunocompromised state.  Neurological: Negative for headaches.     Physical Exam Updated Vital Signs BP (!) 130/99 (BP Location: Right Arm)   Pulse (!) 59   Temp 98.8 F (37.1 C) (Oral)   Resp 16   SpO2 100%   Physical Exam  Constitutional: He is oriented to person, place, and time. He appears well-developed and well-nourished. No distress.  Curled up in fetal position completely covered with sheets. A&O x 4. Non toxic.  HENT:  Head: Normocephalic and atraumatic.  Nose: Nose normal.  Mouth/Throat: Oropharynx is clear and moist. No oropharyngeal exudate.  Eyes: Pupils are equal, round, and reactive to light. Conjunctivae and EOM are normal.  Neck: Normal  range of motion. Neck supple.  Cardiovascular: Normal rate, regular rhythm, normal heart sounds and intact distal pulses.   No murmur heard. Pulmonary/Chest: Effort normal and breath sounds normal. No respiratory distress. He has no wheezes. He has no rales.  Abdominal: Soft. Bowel sounds are normal. He exhibits no distension. There is tenderness.  Vague abdominal  tenderness, most significant at umbilicus. No suprapubic or CVA tenderness.   Musculoskeletal: Normal range of motion. He exhibits no deformity.  Lymphadenopathy:    He has no cervical adenopathy.  Neurological: He is alert and oriented to person, place, and time.  Skin: Skin is warm and dry. Capillary refill takes less than 2 seconds.  Psychiatric: He has a normal mood and affect. His behavior is normal. Judgment and thought content normal.  Nursing note and vitals reviewed.    ED Treatments / Results  Labs (all labs ordered are listed, but only abnormal results are displayed) Labs Reviewed  COMPREHENSIVE METABOLIC PANEL - Abnormal; Notable for the following:       Result Value   Potassium 3.4 (*)    Chloride 99 (*)    All other components within normal limits  URINALYSIS, ROUTINE W REFLEX MICROSCOPIC - Abnormal; Notable for the following:    Specific Gravity, Urine 1.033 (*)    Hgb urine dipstick SMALL (*)    Ketones, ur 80 (*)    Protein, ur 100 (*)    Squamous Epithelial / LPF 0-5 (*)    All other components within normal limits  URINE CULTURE  LIPASE, BLOOD  CBC    EKG  EKG Interpretation None       Radiology Ct Renal Stone Study  Result Date: 03/29/2017 CLINICAL DATA:  Hematuria, unknown cause. Left flank pain for 4 days. EXAM: CT ABDOMEN AND PELVIS WITHOUT CONTRAST TECHNIQUE: Multidetector CT imaging of the abdomen and pelvis was performed following the standard protocol without IV contrast. COMPARISON:  07/18/2013 FINDINGS: Lower chest:  No contributory findings. Hepatobiliary: No focal liver abnormality.No evidence of biliary obstruction or stone. Pancreas: Unremarkable. Spleen: Unremarkable. Adrenals/Urinary Tract: Negative adrenals. Chronic fullness of the upper pole right renal collecting system without perinephric edema or interval atrophy. No hydronephrosis or calcification. Unremarkable bladder. Stomach/Bowel: No obstruction. No inflammatory changes.  Visualization of bowel is limited due to the paucity of intraabdominal fat. Vascular/Lymphatic: No acute vascular abnormality. No mass or adenopathy. Reproductive:Negative. Other: No ascites or pneumoperitoneum. Musculoskeletal: No acute abnormalities. IMPRESSION: 1. No acute finding to explain abdominal pain.  No urolithiasis. 2. 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 Electronically Signed   By: Marnee Spring M.D.   On: 03/29/2017 14:51    Procedures Procedures (including critical care time)  Medications Ordered in ED Medications  ondansetron (ZOFRAN-ODT) disintegrating tablet 4 mg (4 mg Oral Given 03/29/17 1123)  sodium chloride 0.9 % bolus 1,000 mL (0 mLs Intravenous Stopped 03/29/17 1546)  sodium chloride 0.9 % bolus 1,000 mL (0 mLs Intravenous Stopped 03/29/17 1546)  haloperidol lactate (HALDOL) injection 5 mg (5 mg Intravenous Given 03/29/17 1314)  ondansetron (ZOFRAN) injection 4 mg (4 mg Intravenous Given 03/29/17 1520)     Initial Impression / Assessment and Plan / ED Course  I have reviewed the triage vital signs and the nursing notes.  Pertinent labs & imaging results that were available during my care of the patient were reviewed by me and considered in my medical decision making (see chart for details).  Clinical Course as of Mar 29 1624  Mon Mar 29, 2017  1255 Potassium: (!) 3.4 [CG]  1257 RBC / HPF: TOO NUMEROUS TO COUNT [CG]  1257 Hgb urine dipstick: (!) SMALL [CG]    Clinical Course User Index [CG] Liberty Handy, PA-C   26 year old male with history of tobacco and marijuana use presents to ED for evaluation of nausea, vomiting, diffuse abdominal pain and diarrhea 3 days. No fevers, urinary symptoms, melena or hematochezia. On exam, he is nontoxic appearing. Moist mucous membranes. No signs of peritonitis, has vague diffuse abdominal tenderness but no suprapubic or CVA tenderness. No signs of significant dehydration. He is on the phone  during examination. Chart review reveals patient had multiple ED visits for cyclical nausea, vomiting thought to be due to marijuana use. He does admit to heavy, daily marijuana use, last 3 days ago.  Lab work including CBC, BMP, lipase unremarkable. Urine has small hemoglobin and RBCs but no nitrites, leukocytes or WBCs. He denies history of kidney disorders or kidney stones or urinary symptoms. Patient discussed with supervising physician recommended CT renal study. This was normal. Patient was given IV fluids, Haldol, and antiemetics in the ED. Pending PO challenge, given benign exam and workup, anticipate discharge.  Final Clinical Impressions(s) / ED Diagnoses   1620: Nausea, vomiting, abdominal pain resolved. Repeat abdimanl benign. He tolerated PO. Will d/c with antiemetics.   Final diagnoses:  Nausea vomiting and diarrhea    New Prescriptions Current Discharge Medication List       Jerrell Mylar 03/29/17 1625    Lorre Nick, MD 03/30/17 1334

## 2017-03-29 NOTE — Discharge Instructions (Signed)
You presented to ED for nausea, vomiting, abdominal pain and diarrhea. Your lab work and CT study were normal. Your symptoms may be from a stomach virus or heavy use of marijuana. Please take Zofran for nausea. Drink plenty of fluids to stay hydrated. Return to ED for fevers, chills, inability to tolerate oral fluids.

## 2017-03-29 NOTE — ED Notes (Signed)
Pt continues to take cardiac monitor off, pt encouraged multiple times to keep it on.

## 2017-03-29 NOTE — ED Triage Notes (Signed)
Pt to ER for evaluation of epigastric abd pain with nausea vomiting and reports diarrhea occurred Friday. States has poor po intake since onset of nausea vomiting Friday. A/o x4.

## 2017-03-30 LAB — URINE CULTURE: Culture: NO GROWTH

## 2018-04-01 ENCOUNTER — Emergency Department (HOSPITAL_COMMUNITY)
Admission: EM | Admit: 2018-04-01 | Discharge: 2018-04-01 | Disposition: A | Discharge status: Home / Self Care | Payer: Self-pay | Attending: Emergency Medicine | Admitting: Emergency Medicine

## 2018-04-01 ENCOUNTER — Encounter (HOSPITAL_COMMUNITY): Payer: Self-pay | Admitting: Emergency Medicine

## 2018-04-01 ENCOUNTER — Other Ambulatory Visit: Payer: Self-pay

## 2018-04-01 DIAGNOSIS — L0201 Cutaneous abscess of face: Secondary | ICD-10-CM | POA: Insufficient documentation

## 2018-04-01 DIAGNOSIS — F17210 Nicotine dependence, cigarettes, uncomplicated: Secondary | ICD-10-CM | POA: Insufficient documentation

## 2018-04-01 MED ORDER — CLINDAMYCIN HCL 300 MG PO CAPS: 300.0000 mg | ORAL_CAPSULE | Freq: Three times a day (TID) | ORAL | 0 refills | Status: AC

## 2018-04-01 MED ORDER — LIDOCAINE-EPINEPHRINE (PF) 2 %-1:200000 IJ SOLN
10.0000 mL | Freq: Once | INTRAMUSCULAR | Status: AC
  Administered 2018-04-01: 10 mL
  Filled 2018-04-01: qty 20

## 2018-04-01 NOTE — Discharge Instructions (Signed)
I have drained the abscess today. You will need to take antibiotics after today to ensure that the infection is completely resolved. The medication is called Clindamycin. Be sure to take the full seven (7) days worth of this medication.  For pain, you may do Tylenol and/or Ibuprofen. You may also apply warm compresses to the face 3-4 times a day. Do not stick any gauze or bandaging inside the wound. It will heal on its own. There may be a little drainage over the next 1-2 days which is expected. After that you may, lightly cover the area with gauze or a band-aid until healed completely.  Follow-up with a medical provider if you have one or more of the following symptoms: fever; increased redness, warmth or tenderness at the wound site; pain beyond where the initial wound was; unusual discharge.  Thank you for allowing me to take care of you today!

## 2018-04-01 NOTE — ED Provider Notes (Signed)
[facility]-EMERGENCY DEPT Provider Note  CSN: [id number] Arrival date & time: 04/01/18  1309  History   Chief Complaint Chief Complaint  Patient presents with  . Abscess    HPI Robert Bryant is a 27 y.o. male with a medical history of asthma who presented to the ED for facial abscess x4 days. Patient states it began as a pimple on his chin which he popped and had pus drain from it, but states that after that the area got bigger and more painful. Denies fever, chills, neck pain, jaw pain, trismus, otalgia.  Abscess  Location:  Face Facial abscess location:  L cheek Size:  3cm Abscess quality: draining (3cm), fluctuance, painful and redness   Abscess quality: no induration, no warmth and not weeping   Red streaking: no   Duration:  4 days Progression:  Worsening Pain details:    Quality:  Pressure and throbbing   Severity:  Moderate   Timing:  Constant   Progression:  Unchanged Chronicity:  New Context: not diabetes, not immunosuppression, not injected drug use, not insect bite/sting and not skin injury   Relieved by:  Nothing Ineffective treatments:  Draining/squeezing Associated symptoms: no fever     Past Medical History:  Diagnosis Date  . Asthma     There are no active problems to display for this patient.   History reviewed. No pertinent surgical history.      Home Medications    Prior to Admission medications   Medication Sig Start Date End Date Taking? Authorizing Provider  clindamycin (CLEOCIN) 300 MG capsule Take 1 capsule (300 mg total) by mouth 3 (three) times daily for 7 days. 04/01/18 04/08/18  [place] Sima, Jerrel Ivory I, PA-C  ibuprofen (ADVIL,MOTRIN) 200 MG tablet Take 800 mg by mouth every 4 (four) hours as needed for fever, headache, mild pain, moderate pain or cramping.    [provider]  metoCLOPramide (REGLAN) 10 MG tablet Take 1 tablet (10 mg total) by mouth 3 (three) times daily as needed for nausea (headache /  nausea). Patient not taking: Reported on 07/21/2016 10/21/15   Danelle Berry, PA-C  ondansetron (ZOFRAN ODT) 4 MG disintegrating tablet Take 1 tablet (4 mg total) by mouth every 8 (eight) hours as needed for nausea or vomiting. 03/29/17   Liberty Handy, PA-C    Family History No family history on file.  Social History Social History   Tobacco Use  . Smoking status: Current Every Day Smoker    Packs/day: 1.00    Types: Cigarettes  . Smokeless tobacco: Never Used  Substance Use Topics  . Alcohol use: Yes    Comment: occasionally  . Drug use: Yes    Frequency: 7.0 times per week    Types: Marijuana    Comment: daily use     Allergies   Patient has no known allergies.   Review of Systems Review of Systems  Constitutional: Negative for chills and fever.  HENT: Negative for ear pain, facial swelling, sore throat, trouble swallowing and voice change.   Respiratory: Negative.   Musculoskeletal: Negative for neck pain and neck stiffness.  Skin:       Abscess  Allergic/Immunologic: Negative for immunocompromised state.  Hematological: Does not bruise/bleed easily.   Physical Exam Updated Vital Signs BP 116/80 (BP Location: Left Arm)   Pulse 72   Temp 98.6 F (37 C) (Oral)   Resp 16   Wt 50.8 kg   SpO2 100%   BMI 16.54 kg/m  Physical Exam  Constitutional: He appears well-developed and well-nourished. No distress.  HENT:  Head: Normocephalic and atraumatic.    Mouth/Throat: Uvula is midline, oropharynx is clear and moist and mucous membranes are normal. No oral lesions. No trismus in the jaw.  Neck: Normal range of motion and full passive range of motion without pain. Neck supple. No spinous process tenderness and no muscular tenderness present. Normal range of motion present.  Skin: Skin is warm and intact. Capillary refill takes less than 2 seconds.  Nursing note and vitals reviewed.  ED Treatments / Results  Labs (all labs ordered are listed, but only  abnormal results are displayed) Labs Reviewed - No data to display  EKG None  Radiology No results found.  Procedures .Marland KitchenIncision and Drainage Date/Time: 04/01/2018 3:30 PM Performed by: Windy Carina, PA-C Authorized by: Windy Carina, PA-C   Consent:    Consent obtained:  Verbal   Consent given by:  Patient   Risks discussed:  Bleeding, incomplete drainage, pain and infection   Alternatives discussed:  No treatment Location:    Type:  Abscess   Size:  3cm   Location:  Head   Head location:  Face (lower cheek, mandibular region lateral to left lower lip) Pre-procedure details:    Skin preparation:  Betadine Anesthesia (see MAR for exact dosages):    Anesthesia method:  Local infiltration   Local anesthetic:  Lidocaine 2% WITH epi Procedure type:    Complexity:  Simple Procedure details:    Needle aspiration: yes     Needle size:  25 G   Incision types:  Single straight   Incision depth:  Subcutaneous   Scalpel blade:  11   Wound management:  Probed and deloculated and irrigated with saline   Drainage:  Purulent   Drainage amount:  Copious   Wound treatment:  Wound left open   Packing materials:  None Post-procedure details:    Patient tolerance of procedure:  Tolerated well, no immediate complications   (including critical care time)  Medications Ordered in ED Medications  lidocaine-EPINEPHrine (XYLOCAINE W/EPI) 2 %-1:200000 (PF) injection 10 mL (10 mLs Infiltration Given by Other 04/01/18 1339)     Initial Impression / Assessment and Plan / ED Course  Triage vital signs and the nursing notes have been reviewed.  Pertinent labs & imaging results that were available during care of the patient were reviewed and considered in medical decision making (see chart for details).   Patient presents afebrile and in no acute distress with a facial abscess that has been present for 4 days. There are no systemic s/s and immunocompromised history that raises  concern for a widespread infection that needs further evaluation. Simple facial abscess visualized on left mandibular region of face. There are no other findings to suggest deeper tissue, ENT or neck involvement that warrants imaging. Abscess is fluctuant and pus could be expressed from site where patient previously attempted drainage at home.  Final Clinical Impressions(s) / ED Diagnoses  1. Facial Abscess. I&D performed without complication. No packing placed. Rx for Clindamycin x7 days prescribed. Education provided on wound care, follow-up and s/s of infection.  Dispo: Home. After thorough clinical evaluation, this patient is determined to be medically stable and can be safely discharged with the previously mentioned treatment and/or outpatient follow-up/referral(s). At this time, there are no other apparent medical conditions that require further screening, evaluation or treatment.  Final diagnoses:  Abscess of face    ED Discharge Orders  Ordered    clindamycin (CLEOCIN) 300 MG capsule  3 times daily     04/01/18 1334            Deakin Lacek, [place] I, PA-C 04/01/18 1535    Derwood Kaplan, MD 04/02/18 [phone]

## 2018-04-01 NOTE — ED Triage Notes (Signed)
Pt presents with abscess to left chin onset 3-4 days ago.

## 2018-04-24 ENCOUNTER — Emergency Department (HOSPITAL_COMMUNITY): Payer: Self-pay

## 2018-04-24 ENCOUNTER — Encounter (HOSPITAL_COMMUNITY): Payer: Self-pay

## 2018-04-24 ENCOUNTER — Emergency Department (HOSPITAL_COMMUNITY)
Admission: EM | Admit: 2018-04-24 | Discharge: 2018-04-24 | Disposition: A | Discharge status: Home / Self Care | Payer: Self-pay | Attending: Emergency Medicine | Admitting: Emergency Medicine

## 2018-04-24 ENCOUNTER — Other Ambulatory Visit: Payer: Self-pay

## 2018-04-24 ENCOUNTER — Encounter (HOSPITAL_COMMUNITY): Payer: Self-pay | Admitting: *Deleted

## 2018-04-24 DIAGNOSIS — Z5321 Procedure and treatment not carried out due to patient leaving prior to being seen by health care provider: Secondary | ICD-10-CM | POA: Insufficient documentation

## 2018-04-24 DIAGNOSIS — M545 Low back pain: Secondary | ICD-10-CM | POA: Insufficient documentation

## 2018-04-24 DIAGNOSIS — J45909 Unspecified asthma, uncomplicated: Secondary | ICD-10-CM | POA: Insufficient documentation

## 2018-04-24 DIAGNOSIS — F17210 Nicotine dependence, cigarettes, uncomplicated: Secondary | ICD-10-CM | POA: Insufficient documentation

## 2018-04-24 DIAGNOSIS — M546 Pain in thoracic spine: Secondary | ICD-10-CM | POA: Insufficient documentation

## 2018-04-24 MED ORDER — TRAMADOL HCL 50 MG PO TABS: 50.0000 mg | ORAL_TABLET | Freq: Four times a day (QID) | ORAL | 0 refills | Status: AC | PRN

## 2018-04-24 NOTE — ED Notes (Signed)
Pt gave labels to registration stating he didn't want to wait.

## 2018-04-24 NOTE — ED Notes (Signed)
Pt ambulated to hallway bed. No signs of distress.

## 2018-04-24 NOTE — ED Triage Notes (Signed)
Patient is AOx4 and ambulatory. Patient arrived via POV. Patient is complaining of right thoracic/lumbar pain with some right rib/chest pain. Patient has no other complaints.

## 2018-04-24 NOTE — ED Triage Notes (Signed)
Pt c/o SOB x 1 week that also causes him pain in right rib area.  Pt here this morning but left prior to being seen.  Pt is in no apparent distress @ this time.

## 2018-04-28 NOTE — ED Provider Notes (Signed)
[facility]-EMERGENCY DEPT Provider Note   CSN: [id number] Arrival date & time: 04/24/18  1944     History   Chief Complaint Chief Complaint  Patient presents with  . Shortness of Breath    HPI Robert Bryant is a 27 y.o. male.  HPI   27 year old male with right mid to upper back pain.  Onset about a week ago.  Worse with certain movements and palpation.  Sometimes worse with deep breathing.  Does not feel short of breath or although.  No cough.  No fevers or chills.  Does live for work but denies any unusual trauma or strain recently.  No fevers or chills.  No acute neurological complaints..  Past Medical History:  Diagnosis Date  . Asthma     There are no active problems to display for this patient.   History reviewed. No pertinent surgical history.      Home Medications    Prior to Admission medications   Medication Sig Start Date End Date Taking? Authorizing Provider  ibuprofen (ADVIL,MOTRIN) 200 MG tablet Take 800 mg by mouth every 4 (four) hours as needed for fever, headache, mild pain, moderate pain or cramping.    [provider]  metoCLOPramide (REGLAN) 10 MG tablet Take 1 tablet (10 mg total) by mouth 3 (three) times daily as needed for nausea (headache / nausea). Patient not taking: Reported on 07/21/2016 10/21/15   Danelle Berry, PA-C  ondansetron (ZOFRAN ODT) 4 MG disintegrating tablet Take 1 tablet (4 mg total) by mouth every 8 (eight) hours as needed for nausea or vomiting. 03/29/17   Liberty Handy, PA-C  traMADol (ULTRAM) 50 MG tablet Take 1 tablet (50 mg total) by mouth every 6 (six) hours as needed. 04/24/18   Raeford Razor, MD    Family History No family history on file.  Social History Social History   Tobacco Use  . Smoking status: Current Every Day Smoker    Packs/day: 1.00    Types: Cigarettes  . Smokeless tobacco: Never Used  Substance Use Topics  . Alcohol use: Yes    Comment: occasionally  . Drug  use: Yes    Frequency: 7.0 times per week    Types: Marijuana    Comment: daily use     Allergies   Patient has no known allergies.   Review of Systems Review of Systems  All systems reviewed and negative, other than as noted in HPI.  Physical Exam Updated Vital Signs BP 114/68 (BP Location: Left Arm)   Pulse 86   Temp 99.5 F (37.5 C) (Oral)   Resp 16   Ht 5\' 9"  (1.753 m)   Wt 58.1 kg   SpO2 99%   BMI 18.90 kg/m   Physical Exam  Constitutional: He appears well-developed and well-nourished. No distress.  HENT:  Head: Normocephalic and atraumatic.  Eyes: Conjunctivae are normal. Right eye exhibits no discharge. Left eye exhibits no discharge.  Neck: Neck supple.  Cardiovascular: Normal rate, regular rhythm and normal heart sounds. Exam reveals no gallop and no friction rub.  No murmur heard. Pulmonary/Chest: Effort normal and breath sounds normal. No respiratory distress.  Abdominal: Soft. He exhibits no distension. There is no tenderness.  Musculoskeletal: He exhibits no edema or tenderness.  Lower extremities symmetric as compared to each other. No calf tenderness. Negative Homan's. No palpable cords.    Neurological: He is alert.  Skin: Skin is warm and dry.  Psychiatric: He has a normal mood and affect.  His behavior is normal. Thought content normal.  Nursing note and vitals reviewed.    ED Treatments / Results  Labs (all labs ordered are listed, but only abnormal results are displayed) Labs Reviewed - No data to display  EKG EKG Interpretation  Date/Time:  Sunday April 24 2018 22:02:26 EDT Ventricular Rate:  72 PR Interval:  122 QRS Duration: 80 QT Interval:  400 QTC Calculation: 438 R Axis:   92 Text Interpretation:  Normal sinus rhythm Rightward axis Moderate voltage criteria for LVH, may be normal variant Nonspecific ST abnormality Abnormal ECG No significant change since last tracing Confirmed by Allen, Anthony ([id number]) on 04/25/2018 7:25:12  PM   Radiology No results found.   Dg Chest 2 View  Result Date: 04/24/2018 CLINICAL DATA:  Right chest and lumbar pain. EXAM: CHEST - 2 VIEW COMPARISON:  07/18/2013 and 07/08/2011 FINDINGS: Lungs are adequately inflated and otherwise clear. Cardiomediastinal silhouette is within normal. Bones and soft tissues are normal. IMPRESSION: No active cardiopulmonary disease. Electronically Signed   By: Daniel  Boyle M.D.   On: 04/24/2018 20:31   Procedures Procedures (including critical care time)  Medications Ordered in ED Medications - No data to display   Initial Impression / Assessment and Plan / ED Course  I have reviewed the triage vital signs and the nursing notes.  Pertinent labs & imaging results that were available during my care of the patient were reviewed by me and considered in my medical decision making (see chart for details).     27 year old male with right mid to upper thoracic back pain.  Likely musculoskeletal given reproducibility.  No midline tenderness.  Nonfocal neuro exam.  Lungs are clear.  Vitals are fine.  I doubt emergent process.  Symptom medic treatment.  Final Clinical Impressions(s) / ED Diagnoses   Final diagnoses:  Acute right-sided thoracic back pain    ED Discharge Orders         Ordered    traMADol (ULTRAM) 50 MG tablet  Every 6 hours PRN     10 /27/19 2242           Raeford Razor, MD 04/28/18 1623

## 2018-05-02 ENCOUNTER — Other Ambulatory Visit: Payer: Self-pay

## 2018-05-02 ENCOUNTER — Emergency Department (HOSPITAL_BASED_OUTPATIENT_CLINIC_OR_DEPARTMENT_OTHER)
Admission: EM | Admit: 2018-05-02 | Discharge: 2018-05-02 | Disposition: A | Discharge status: Home / Self Care | Payer: Self-pay | Attending: Emergency Medicine | Admitting: Emergency Medicine

## 2018-05-02 ENCOUNTER — Encounter: Payer: Self-pay | Admitting: Medical

## 2018-05-02 ENCOUNTER — Encounter (HOSPITAL_BASED_OUTPATIENT_CLINIC_OR_DEPARTMENT_OTHER): Payer: Self-pay | Admitting: Emergency Medicine

## 2018-05-02 ENCOUNTER — Ambulatory Visit (INDEPENDENT_AMBULATORY_CARE_PROVIDER_SITE_OTHER): Payer: Self-pay | Admitting: Medical

## 2018-05-02 ENCOUNTER — Emergency Department (HOSPITAL_BASED_OUTPATIENT_CLINIC_OR_DEPARTMENT_OTHER): Payer: Self-pay

## 2018-05-02 VITALS — BP 149/79 | HR 123 | Temp 100.4°F | Resp 16 | Ht 70.5 in | Wt 126.2 lb

## 2018-05-02 DIAGNOSIS — F17210 Nicotine dependence, cigarettes, uncomplicated: Secondary | ICD-10-CM | POA: Insufficient documentation

## 2018-05-02 DIAGNOSIS — J189 Pneumonia, unspecified organism: Secondary | ICD-10-CM | POA: Insufficient documentation

## 2018-05-02 DIAGNOSIS — R079 Chest pain, unspecified: Secondary | ICD-10-CM

## 2018-05-02 DIAGNOSIS — M546 Pain in thoracic spine: Secondary | ICD-10-CM

## 2018-05-02 DIAGNOSIS — R059 Cough, unspecified: Secondary | ICD-10-CM

## 2018-05-02 DIAGNOSIS — Z79899 Other long term (current) drug therapy: Secondary | ICD-10-CM | POA: Insufficient documentation

## 2018-05-02 DIAGNOSIS — R0600 Dyspnea, unspecified: Secondary | ICD-10-CM

## 2018-05-02 DIAGNOSIS — R05 Cough: Secondary | ICD-10-CM

## 2018-05-02 DIAGNOSIS — E876 Hypokalemia: Secondary | ICD-10-CM

## 2018-05-02 DIAGNOSIS — J45909 Unspecified asthma, uncomplicated: Secondary | ICD-10-CM | POA: Insufficient documentation

## 2018-05-02 LAB — CBC WITH DIFFERENTIAL/PLATELET
ABS IMMATURE GRANULOCYTES: 0.23 10*3/uL — AB (ref 0.00–0.07)
Basophils Absolute: 0 10*3/uL (ref 0.0–0.1)
Basophils Relative: 0 %
EOS PCT: 0 %
Eosinophils Absolute: 0 10*3/uL (ref 0.0–0.5)
HEMATOCRIT: 34.6 % — AB (ref 39.0–52.0)
HEMOGLOBIN: 11.8 g/dL — AB (ref 13.0–17.0)
Immature Granulocytes: 1 %
LYMPHS ABS: 1 10*3/uL (ref 0.7–4.0)
LYMPHS PCT: 5 %
MCH: 30.2 pg (ref 26.0–34.0)
MCHC: 34.1 g/dL (ref 30.0–36.0)
MCV: 88.5 fL (ref 80.0–100.0)
MONO ABS: 1.3 10*3/uL — AB (ref 0.1–1.0)
MONOS PCT: 7 %
NEUTROS ABS: 16.4 10*3/uL — AB (ref 1.7–7.7)
Neutrophils Relative %: 87 %
Platelets: 273 10*3/uL (ref 150–400)
RBC: 3.91 MIL/uL — ABNORMAL LOW (ref 4.22–5.81)
RDW: 14 % (ref 11.5–15.5)
WBC: 19 10*3/uL — ABNORMAL HIGH (ref 4.0–10.5)
nRBC: 0 % (ref 0.0–0.2)

## 2018-05-02 LAB — BASIC METABOLIC PANEL
Anion gap: 15 (ref 5–15)
BUN: 12 mg/dL (ref 6–20)
CHLORIDE: 88 mmol/L — AB (ref 98–111)
CO2: 27 mmol/L (ref 22–32)
CREATININE: 0.95 mg/dL (ref 0.61–1.24)
Calcium: 8.1 mg/dL — ABNORMAL LOW (ref 8.9–10.3)
GFR calc Af Amer: >60 mL/min (ref >60)
GFR calc non Af Amer: >60 mL/min (ref >60)
GLUCOSE: 109 mg/dL — AB (ref 70–99)
Potassium: 2.6 mmol/L — CL (ref 3.5–5.1)
SODIUM: 130 mmol/L — AB (ref 135–145)

## 2018-05-02 LAB — I-STAT CG4 LACTIC ACID, ED: LACTIC ACID, VENOUS: 1.21 mmol/L (ref 0.5–1.9)

## 2018-05-02 MED ORDER — SODIUM CHLORIDE 0.9 % IV BOLUS
1000.0000 mL | Freq: Once | INTRAVENOUS | Status: AC
  Administered 2018-05-02: 1000 mL via INTRAVENOUS

## 2018-05-02 MED ORDER — IBUPROFEN 800 MG PO TABS
800.0000 mg | ORAL_TABLET | Freq: Once | ORAL | Status: AC
  Administered 2018-05-02: 800 mg via ORAL
  Filled 2018-05-02: qty 1

## 2018-05-02 MED ORDER — POTASSIUM CHLORIDE CRYS ER 20 MEQ PO TBCR
40.0000 meq | EXTENDED_RELEASE_TABLET | Freq: Once | ORAL | Status: AC
  Administered 2018-05-02: 40 meq via ORAL
  Filled 2018-05-02: qty 2

## 2018-05-02 MED ORDER — IBUPROFEN 800 MG PO TABS: 800.0000 mg | ORAL_TABLET | Freq: Three times a day (TID) | ORAL | 0 refills | Status: AC

## 2018-05-02 MED ORDER — POTASSIUM CHLORIDE CRYS ER 20 MEQ PO TBCR: 20.0000 meq | EXTENDED_RELEASE_TABLET | Freq: Every day | ORAL | 0 refills | Status: AC

## 2018-05-02 MED ORDER — ACETAMINOPHEN 325 MG PO TABS
650.0000 mg | ORAL_TABLET | Freq: Once | ORAL | Status: AC
  Administered 2018-05-02: 650 mg via ORAL
  Filled 2018-05-02: qty 2

## 2018-05-02 MED ORDER — DOXYCYCLINE HYCLATE 100 MG PO CAPS: 100.0000 mg | ORAL_CAPSULE | Freq: Two times a day (BID) | ORAL | 0 refills | Status: DC

## 2018-05-02 MED ORDER — SODIUM CHLORIDE 0.9 % IV BOLUS: 1000.0000 mL | Freq: Once | INTRAVENOUS | Status: DC

## 2018-05-02 MED ORDER — DOXYCYCLINE HYCLATE 100 MG PO TABS
100.0000 mg | ORAL_TABLET | Freq: Once | ORAL | Status: AC
  Administered 2018-05-02: 100 mg via ORAL
  Filled 2018-05-02: qty 1

## 2018-05-02 NOTE — ED Notes (Signed)
Date and time results received: 05/02/18 1839  Test: Potassium Critical Value: 2.6 Name of Provider Notified: Asher Muir Ward  Orders Received? Or Actions Taken?: Actions Taken: EDP made aware, cardiac monitoring

## 2018-05-02 NOTE — Discharge Instructions (Addendum)
It was my pleasure taking care of you today!   Please take all of your antibiotics until finished!   Alternate between Tylenol and ibuprofen as needed for fever control/pain.  Drink plenty of fluids.  You need to stay hydrated.  Quit smoking!   Follow-up with your primary care doctor for recheck and likely repeat x-ray in about 1 week.  Return to emergency department for difficulty breathing, worsening symptoms, new symptoms, any additional concerns.

## 2018-05-02 NOTE — ED Triage Notes (Signed)
Patient reports shortness of breath x 1 week.  Worse when ambulating.  Productive cough. Sent from PCP.

## 2018-05-02 NOTE — Progress Notes (Signed)
Subjective:    Patient ID: Robert Bryant, male    DOB: 1990-07-30, 27 y.o.   MRN: [id number]  HPI  Pt in with some cough, dyspnea, rt side back pain, productive cough and possible wheezing. Pt states productive cough present for a couple of day.   Pt states when he sneezes or cough will get right side chest wall.   Pt state evaluate ED Oct 30 th with [facility] at on review he was diagnosed with thoracic muscle strain. He is on flexeril and advil. These meds helped some with pain.  On October 30 th seen at [facility] long. Chest xray. Was negative for pneumonia.  He smokes pack of cigarretes a day and 5 blunts.   Review of Systems  Respiratory: Positive for shortness of breath. Negative for wheezing.        Shortness of breath on exertion. But not severe.  Cardiovascular: Negative for chest pain and palpitations.  Gastrointestinal: Negative for abdominal pain.  Musculoskeletal: Positive for back pain.       Back pain. When lying flat. Eases up sitting staight up  Skin: Negative for rash.  Psychiatric/Behavioral: Negative for behavioral problems and confusion. Suicidal ideas:   The patient is not nervous/anxious and is not hyperactive.     Past Medical History:  Diagnosis Date  . Asthma      Social History   Socioeconomic History  . Marital status: Single    Spouse name: Not on file  . Number of children: Not on file  . Years of education: Not on file  . Highest education level: Not on file  Occupational History  . Not on file  Social Needs  . Financial resource strain: Not on file  . Food insecurity:    Worry: Not on file    Inability: Not on file  . Transportation needs:    Medical: Not on file    Non-medical: Not on file  Tobacco Use  . Smoking status: Current Every Day Smoker    Packs/day: 1.00    Types: Cigarettes  . Smokeless tobacco: Never Used  Substance and Sexual Activity  . Alcohol use: Yes    Comment: occasionally  . Drug use: Yes    Frequency:  7.0 times per week    Types: Marijuana    Comment: daily use  . Sexual activity: Yes    Birth control/protection: None  Lifestyle  . Physical activity:    Days per week: Not on file    Minutes per session: Not on file  . Stress: Not on file  Relationships  . Social connections:    Talks on phone: Not on file    Gets together: Not on file    Attends religious service: Not on file    Active member of club or organization: Not on file    Attends meetings of clubs or organizations: Not on file    Relationship status: Not on file  . Intimate partner violence:    Fear of current or ex partner: Not on file    Emotionally abused: Not on file    Physically abused: Not on file    Forced sexual activity: Not on file  Other Topics Concern  . Not on file  Social History Narrative  . Not on file    No past surgical history on file.  Family History  Problem Relation Age of Onset  . Cancer Father     No Known Allergies  Current Outpatient Medications on File Prior  to Visit  Medication Sig Dispense Refill  . cyclobenzaprine (FLEXERIL) 10 MG tablet Take by mouth.    Marland Kitchen ibuprofen (ADVIL,MOTRIN) 200 MG tablet Take 800 mg by mouth every 4 (four) hours as needed for fever, headache, mild pain, moderate pain or cramping.    . metoCLOPramide (REGLAN) 10 MG tablet Take 1 tablet (10 mg total) by mouth 3 (three) times daily as needed for nausea (headache / nausea). 20 tablet 0  . naproxen (NAPROSYN) 500 MG tablet Take by mouth.    . ondansetron (ZOFRAN ODT) 4 MG disintegrating tablet Take 1 tablet (4 mg total) by mouth every 8 (eight) hours as needed for nausea or vomiting. 10 tablet 0  . traMADol (ULTRAM) 50 MG tablet Take 1 tablet (50 mg total) by mouth every 6 (six) hours as needed. 10 tablet 0   No current facility-administered medications on file prior to visit.     BP (!) 149/79   Pulse (!) 123   Temp (!) 100.4 F (38 C) (Oral)   Resp 16   Ht 5' 10.5" (1.791 m)   Wt 126 lb 3.2 oz  (57.2 kg)   SpO2 100%   BMI 17.85 kg/m        Objective:   Physical Exam   General  Mental Status - Alert. General Appearance - Well groomed. Not in acute distress.  Skin Rashes- No Rashes.  HEENT Head- Normal. Ear Auditory Canal - Left- Normal. Right - Normal.Tympanic Membrane- Left- Normal. Right- Normal. Eye Sclera/Conjunctiva- Left- Normal. Right- Normal. Nose & Sinuses Nasal Mucosa- Left-  Boggy and Congested. Right-  Boggy and  Congested.Bilateral no maxillary and no  frontal sinus pressure. Mouth & Throat Lips: Upper Lip- Normal: no dryness, cracking, pallor, cyanosis, or vesicular eruption. Lower Lip-Normal: no dryness, cracking, pallor, cyanosis or vesicular eruption. Buccal Mucosa- Bilateral- No Aphthous ulcers. Oropharynx- No Discharge or Erythema. Tonsils: Characteristics- Bilateral- No Erythema or Congestion. Size/Enlargement- Bilateral- No enlargement. Discharge- bilateral-None.  Neck Neck- Supple. No Masses.   Chest and Lung Exam Auscultation: Breath Sounds:-Clear even and unlabored.(walking down hall pulse decreased to 90% very briefly but then came up quickly. At rest o2 sat 93-96%.  Diminished breath sounds.  Cardiovascular Auscultation:Rythm- Regular, rate and rhythm. Murmurs & Other Heart Sounds:Ausculatation of the heart reveal- No Murmurs.  Lymphatic Head & Neck General Head & Neck Lymphatics: Bilateral: Description- No Localized lymphadenopathy.  Lower ext- calfs symmetric. No pedal edema.       Assessment & Plan:  For your recent right side chest wall pain/back pain along with a fever, tachycardia and low oxygen saturation on ambulating, I do think it is best for you to be evaluated in the emergency department now.  You have been evaluated twice recently with no cause found.  At this timeyour recent illness might be a combination of asthma exacerbation with pneumonia.  Other more serious  etiology such as spontaneous pneumothorax or a PE  less likely.  But based on the fact that your oxygen did decrease briefly 90% on ambulation I do think the best to be worked up in the emergency department now.  I did talk with the ED physician and notified him of your recent presentation.  Please go downstairs and show this AVS to front desk/triage staff.  Follow-up with me as needed post ED evaluation.  Esperanza Richters, PA-C

## 2018-05-02 NOTE — ED Provider Notes (Signed)
[facility] EMERGENCY DEPARTMENT Provider Note   CSN: [id number] Arrival date & time: 05/02/18  1640     History   Chief Complaint Chief Complaint  Patient presents with  . Shortness of Breath    HPI Robert Bryant is a 27 y.o. male.  The history is provided by the patient and medical records. No language interpreter was used.  Shortness of Breath  Associated symptoms include a fever and cough. Pertinent negatives include no chest pain.   Robert Bryant is a 27 y.o. male  with a PMH of asthma who presents to the Emergency Department from primary care doctor for persistent right-sided back pain for about a week.  Developed a productive cough and dyspnea a day or 2 ago.  Associated with subjective fever at home.  Pain worse with deep breaths and movement.  Temperature of 100.4 in PCP office earlier today.  He tried taking Tylenol and tramadol at home with no improvement in his pain.  Per chart review, oxygenation dropped to 90% while ambulating, therefore he was sent to the emergency department for further work-up.  He actually denies any chest pain.  No abdominal pain, nausea or vomiting.  Past Medical History:  Diagnosis Date  . Asthma     There are no active problems to display for this patient.   History reviewed. No pertinent surgical history.      Home Medications    Prior to Admission medications   Medication Sig Start Date End Date Taking? Authorizing Provider  cyclobenzaprine (FLEXERIL) 10 MG tablet Take by mouth. 04/28/18 05/08/18  [provider]  ibuprofen (ADVIL,MOTRIN) 200 MG tablet Take 800 mg by mouth every 4 (four) hours as needed for fever, headache, mild pain, moderate pain or cramping.    [provider]  metoCLOPramide (REGLAN) 10 MG tablet Take 1 tablet (10 mg total) by mouth 3 (three) times daily as needed for nausea (headache / nausea). 10/21/15   Danelle Berry, PA-C  naproxen (NAPROSYN) 500 MG tablet Take by mouth.  04/28/18   [provider]  ondansetron (ZOFRAN ODT) 4 MG disintegrating tablet Take 1 tablet (4 mg total) by mouth every 8 (eight) hours as needed for nausea or vomiting. 03/29/17   Liberty Handy, PA-C  traMADol (ULTRAM) 50 MG tablet Take 1 tablet (50 mg total) by mouth every 6 (six) hours as needed. 04/24/18   Raeford Razor, MD    Family History Family History  Problem Relation Age of Onset  . Cancer Father     Social History Social History   Tobacco Use  . Smoking status: Current Every Day Smoker    Packs/day: 1.00    Types: Cigarettes  . Smokeless tobacco: Never Used  Substance Use Topics  . Alcohol use: Yes    Comment: occasionally  . Drug use: Yes    Frequency: 7.0 times per week    Types: Marijuana    Comment: daily use     Allergies   Patient has no known allergies.   Review of Systems Review of Systems  Constitutional: Positive for chills and fever.  HENT: Positive for congestion.   Respiratory: Positive for cough and shortness of breath.   Cardiovascular: Negative for chest pain.  Musculoskeletal: Positive for back pain.  All other systems reviewed and are negative.    Physical Exam Updated Vital Signs BP 107/68 (BP Location: Right Arm)   Pulse (!) 118   Temp (!) 100.9 F (38.3 C) (Oral)   Resp  20   Ht 5' 10.5" (1.791 m)   Wt 57.2 kg   SpO2 97%   BMI 17.82 kg/m   Physical Exam  Constitutional: He is oriented to person, place, and time. He appears well-developed and well-nourished. No distress.  HENT:  Head: Normocephalic and atraumatic.  Neck: Neck supple.  Cardiovascular: Normal heart sounds.  No murmur heard. Tachycardic but regular.  Pulmonary/Chest: Effort normal. No respiratory distress. He has no wheezes. He exhibits tenderness.  Lung sounds diminished in the right lung.  No wheezing appreciated.  Speaking in full sentences without any difficulty.  96-100% O2 on room air. Tenderness to right upper chest wall and right  thoracic back without overlying skin changes.   Abdominal: Soft. He exhibits no distension. There is no tenderness.  Musculoskeletal: He exhibits no edema.  Neurological: He is alert and oriented to person, place, and time.  Skin: Skin is warm and dry.  Nursing note and vitals reviewed.    ED Treatments / Results  Labs (all labs ordered are listed, but only abnormal results are displayed) Labs Reviewed  CBC WITH DIFFERENTIAL/PLATELET - Abnormal; Notable for the following components:      Result Value   WBC 19.0 (*)    RBC 3.91 (*)    Hemoglobin 11.8 (*)    HCT 34.6 (*)    Neutro Abs 16.4 (*)    Monocytes Absolute 1.3 (*)    Abs Immature Granulocytes 0.23 (*)    All other components within normal limits  BASIC METABOLIC PANEL - Abnormal; Notable for the following components:   Sodium 130 (*)    Potassium 2.6 (*)    Chloride 88 (*)    Glucose, Bld 109 (*)    Calcium 8.1 (*)    All other components within normal limits  I-STAT CG4 LACTIC ACID, ED    EKG None  Radiology Dg Chest 2 View  Result Date: 05/02/2018 CLINICAL DATA:  Acute RIGHT chest pain and shortness of breath for 1 week. EXAM: CHEST - 2 VIEW COMPARISON:  04/24/2018 FINDINGS: New RIGHT UPPER and LOWER lobe airspace disease/consolidation likely represents pneumonia. A trace RIGHT pleural effusion is noted. Subsegmental atelectasis within the mid and LOWER RIGHT lung noted. The LEFT lung is clear. No pneumothorax. The cardiomediastinal silhouette is unremarkable. No acute bony abnormalities are identified. IMPRESSION: RIGHT UPPER and LOWER lobe airspace disease/consolidation with trace RIGHT pleural effusion, new since 04/24/2018 and likely representing multifocal pneumonia. Correlate clinically. Electronically Signed   By: Harmon Pier M.D.   On: 05/02/2018 17:40    Procedures Procedures (including critical care time)  Medications Ordered in ED Medications  sodium chloride 0.9 % bolus 1,000 mL (1,000 mLs  Intravenous New Bag/Given 05/02/18 1819)  acetaminophen (TYLENOL) tablet 650 mg (650 mg Oral Given 05/02/18 1713)  ibuprofen (ADVIL,MOTRIN) tablet 800 mg (800 mg Oral Given 05/02/18 1818)  doxycycline (VIBRA-TABS) tablet 100 mg (100 mg Oral Given 05/02/18 1818)  potassium chloride SA (K-DUR,KLOR-CON) CR tablet 40 mEq (40 mEq Oral Given 05/02/18 1847)     Initial Impression / Assessment and Plan / ED Course  I have reviewed the triage vital signs and the nursing notes.  Pertinent labs & imaging results that were available during my care of the patient were reviewed by me and considered in my medical decision making (see chart for details).    Robert Bryant is a 27 y.o. male who presents to ED from primary care doctor's office for upper back pain, cough, congestion.  On  exam, patient with low-grade fever.  T-max of 100.9.  Given antipyretics in the emergency department.  He was initially tachycardic as well.  This improved after treating fever and giving 1 L of fluids.  Labs reviewed and notable for white count of 19.  Lactic acid normal.  He did have low potassium as well.  Given supplementation in the ED and home on p.o. supplementation for a few days.  Chest x-ray shows findings consistent with multifocal pneumonia.  At this correlates with clinical presentation as well.  He ambulated in the emergency department and maintained oxygenation saturation above 94%.  Discussed discharge home versus admission for multifocal pneumonia.  He would like to go home and feels comfortable doing so.  Discussed importance of smoking cessation.  He denies any vaping history.  Will treat with doxycycline.  Strongly encouraged close PCP follow-up within 1 week.  Reasons to return to the emergency department discussed with patient at length.  He understands to have a low threshold to return if he is not improving or acutely worsens as well as for any new or worrisome symptoms.  All questions answered.  Patient discussed  with Dr. Lockie Mola who agrees with treatment plan.    Final Clinical Impressions(s) / ED Diagnoses   Final diagnoses:  Multifocal pneumonia    ED Discharge Orders    None       Ward, Chase Picket, PA-C 05/02/18 2000    Virgina Norfolk, DO 05/03/18 0016    Virgina Norfolk, DO 05/03/18 0018

## 2018-05-02 NOTE — Patient Instructions (Signed)
For your recent right side chest wall pain/back pain along with a fever, tachycardia and low oxygen saturation on ambulating, I do think it is best for you to be evaluated in the emergency department now.  You have been evaluated twice recently with no cause found.  At this timeyour recent illness might be a combination of asthma exacerbation with pneumonia.  Other more serious  etiology such as spontaneous pneumothorax or a PE less likely.  But based on the fact that your oxygen did decrease briefly 90% on ambulation I do think the best to be worked up in the emergency department now.  I did talk with the ED physician and notified him of your recent presentation.  Please go downstairs and show this AVS to front desk/triage staff.  Follow-up with me as needed post ED evaluation.

## 2018-05-16 ENCOUNTER — Ambulatory Visit: Payer: Self-pay | Admitting: Medical

## 2018-05-23 ENCOUNTER — Ambulatory Visit: Payer: Self-pay | Admitting: Medical

## 2018-05-23 DIAGNOSIS — Z0289 Encounter for other administrative examinations: Secondary | ICD-10-CM

## 2019-02-19 ENCOUNTER — Other Ambulatory Visit: Payer: Self-pay

## 2019-02-19 ENCOUNTER — Encounter (HOSPITAL_COMMUNITY): Payer: Self-pay | Admitting: Emergency Medicine

## 2019-02-19 ENCOUNTER — Emergency Department (HOSPITAL_COMMUNITY)
Admission: EM | Admit: 2019-02-19 | Discharge: 2019-02-19 | Disposition: A | Discharge status: Home / Self Care | Payer: Self-pay | Attending: Emergency Medicine | Admitting: Emergency Medicine

## 2019-02-19 DIAGNOSIS — R112 Nausea with vomiting, unspecified: Secondary | ICD-10-CM | POA: Insufficient documentation

## 2019-02-19 DIAGNOSIS — R9431 Abnormal electrocardiogram [ECG] [EKG]: Secondary | ICD-10-CM | POA: Insufficient documentation

## 2019-02-19 DIAGNOSIS — F17210 Nicotine dependence, cigarettes, uncomplicated: Secondary | ICD-10-CM | POA: Insufficient documentation

## 2019-02-19 DIAGNOSIS — J45909 Unspecified asthma, uncomplicated: Secondary | ICD-10-CM | POA: Insufficient documentation

## 2019-02-19 LAB — CBC
HCT: 45.4 % (ref 39.0–52.0)
Hemoglobin: 15.3 g/dL (ref 13.0–17.0)
MCH: 32.4 pg (ref 26.0–34.0)
MCHC: 33.7 g/dL (ref 30.0–36.0)
MCV: 96.2 fL (ref 80.0–100.0)
Platelets: 272 10*3/uL (ref 150–400)
RBC: 4.72 MIL/uL (ref 4.22–5.81)
RDW: 14.2 % (ref 11.5–15.5)
WBC: 10.9 10*3/uL — ABNORMAL HIGH (ref 4.0–10.5)
nRBC: 0 % (ref 0.0–0.2)

## 2019-02-19 LAB — COMPREHENSIVE METABOLIC PANEL
ALT: 23 U/L (ref 0–44)
AST: 33 U/L (ref 15–41)
Albumin: 4.7 g/dL (ref 3.5–5.0)
Alkaline Phosphatase: 80 U/L (ref 38–126)
Anion gap: 14 (ref 5–15)
BUN: 11 mg/dL (ref 6–20)
CO2: 22 mmol/L (ref 22–32)
Calcium: 9.5 mg/dL (ref 8.9–10.3)
Chloride: 106 mmol/L (ref 98–111)
Creatinine, Ser: 0.88 mg/dL (ref 0.61–1.24)
GFR calc Af Amer: >60 mL/min (ref >60)
GFR calc non Af Amer: >60 mL/min (ref >60)
Glucose, Bld: 172 mg/dL — ABNORMAL HIGH (ref 70–99)
Potassium: 3.2 mmol/L — ABNORMAL LOW (ref 3.5–5.1)
Sodium: 142 mmol/L (ref 135–145)
Total Bilirubin: 0.6 mg/dL (ref 0.3–1.2)
Total Protein: 8.2 g/dL — ABNORMAL HIGH (ref 6.5–8.1)

## 2019-02-19 LAB — LIPASE, BLOOD: Lipase: 24 U/L (ref 11–51)

## 2019-02-19 LAB — MAGNESIUM: Magnesium: 1.7 mg/dL (ref 1.7–2.4)

## 2019-02-19 MED ORDER — SODIUM CHLORIDE 0.9 % IV BOLUS
1000.0000 mL | Freq: Once | INTRAVENOUS | Status: AC
  Administered 2019-02-19: 1000 mL via INTRAVENOUS

## 2019-02-19 MED ORDER — ONDANSETRON HCL 4 MG/2ML IJ SOLN
4.0000 mg | Freq: Once | INTRAMUSCULAR | Status: AC
  Administered 2019-02-19: 08:00:00 4 mg via INTRAVENOUS
  Filled 2019-02-19: qty 2

## 2019-02-19 MED ORDER — LORAZEPAM 2 MG/ML IJ SOLN
1.0000 mg | Freq: Once | INTRAMUSCULAR | Status: AC
  Administered 2019-02-19: 08:00:00 1 mg via INTRAVENOUS
  Filled 2019-02-19: qty 1

## 2019-02-19 MED ORDER — FAMOTIDINE 20 MG PO TABS: 20.0000 mg | ORAL_TABLET | Freq: Two times a day (BID) | ORAL | 0 refills | Status: AC

## 2019-02-19 MED ORDER — FAMOTIDINE IN NACL 20-0.9 MG/50ML-% IV SOLN
20.0000 mg | Freq: Once | INTRAVENOUS | Status: AC
  Administered 2019-02-19: 08:00:00 20 mg via INTRAVENOUS
  Filled 2019-02-19: qty 50

## 2019-02-19 MED ORDER — POTASSIUM CHLORIDE 10 MEQ/100ML IV SOLN
10.0000 meq | INTRAVENOUS | Status: AC
  Administered 2019-02-19 (×2): 10 meq via INTRAVENOUS
  Filled 2019-02-19 (×2): qty 100

## 2019-02-19 MED ORDER — SODIUM CHLORIDE 0.9 % IV BOLUS
1000.0000 mL | Freq: Once | INTRAVENOUS | Status: AC
  Administered 2019-02-19: 08:00:00 1000 mL via INTRAVENOUS

## 2019-02-19 NOTE — Discharge Instructions (Addendum)
You were seen in the emergency department today for nausea and vomiting as well as abdominal pain.  Your labs were reassuring.  Your EKG was abnormal, this needs to be repeated by primary care provider within the next 3 to 5 days.  Please stop smoking marijuana as this is likely contributing to your nausea and vomiting especially when consuming alcohol with it.  Please try to decrease your alcohol intake as well.  We are sending you home with Pepcid take 2 times per day for the next 1 week.  We have prescribed you new medication(s) today. Discuss the medications prescribed today with your pharmacist as they can have adverse effects and interactions with your other medicines including over the counter and prescribed medications. Seek medical evaluation if you start to experience new or abnormal symptoms after taking one of these medicines, seek care immediately if you start to experience difficulty breathing, feeling of your throat closing, facial swelling, or rash as these could be indications of a more serious allergic reaction   Follow-up with primary care within 3 days.  Return to the ER for new or worsening symptoms including but not limited to inability to keep fluids down, fever, blood in your stool, or any other concerns.

## 2019-02-19 NOTE — ED Provider Notes (Signed)
[facility] DEPT Provider Note   CSN: [id number] Arrival date & time: 02/19/19  0719     History   Chief Complaint Chief Complaint  Patient presents with  . Emesis  . Abdominal Pain    HPI Robert Bryant is a 28 y.o. male with a hx of tobacco abuse & asthma who presents to the ED w/ complaints of N/V that began @ midnight. Patient reports 10-20 episodes of non bloody non bilious emesis. Shortly after vomiting ensued he developed epigastric abdominal pain that is a 20/10 in severity, worse with movement, no other alleviating/aggravating factors. He states his last BM was just PTA and was non bloody diarrhea. No sick contacts w/ similar sxs. No recent foreign travel or abx. 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 He also consumed EtOH last night, most recently around midnight. Denies fever, chills, hematemesis, constipation, melena, hematochezia, dysuria, or testicular pain/swelling      HPI  Past Medical History:  Diagnosis Date  . Asthma     There are no active problems to display for this patient.   History reviewed. No pertinent surgical history.      Home Medications    Prior to Admission medications   Medication Sig Start Date End Date Taking? Authorizing Provider  ibuprofen (ADVIL,MOTRIN) 800 MG tablet Take 1 tablet (800 mg total) by mouth 3 (three) times daily. Patient not taking: Reported on 02/19/2019 05/02/18   Ward, Ozella Almond, PA-C  metoCLOPramide (REGLAN) 10 MG tablet Take 1 tablet (10 mg total) by mouth 3 (three) times daily as needed for nausea (headache / nausea). Patient not taking: Reported on 02/19/2019 10/21/15   Delsa Grana, PA-C  naproxen (NAPROSYN) 500 MG tablet Take 500 mg by mouth daily as needed for mild pain.  04/28/18   [provider]  ondansetron (ZOFRAN ODT) 4 MG disintegrating tablet Take 1 tablet (4 mg total) by mouth every 8 (eight) hours as needed for nausea or vomiting. Patient not taking: Reported on 02/19/2019 03/29/17   Kinnie Feil, PA-C  potassium chloride SA (K-DUR,KLOR-CON) 20 MEQ tablet Take 1 tablet (20 mEq total) by mouth daily. Patient not taking: Reported on 02/19/2019 05/02/18   Ward, Ozella Almond, PA-C  traMADol (ULTRAM) 50 MG tablet Take 1 tablet (50 mg total) by mouth every 6 (six) hours as needed. Patient not taking: Reported on 02/19/2019 04/24/18   Virgel Manifold, MD    Family History Family History  Problem Relation Age of Onset  . Cancer Father     Social History Social History   Tobacco Use  . Smoking status: Current Every Day Smoker    Packs/day: 1.00    Types: Cigarettes  . Smokeless tobacco: Never Used  Substance Use Topics  . Alcohol use: Yes    Comment: occasionally  . Drug use: Yes    Frequency: 7.0 times per week    Types: Marijuana    Comment: daily use     Allergies   Patient has no known allergies.   Review of Systems Review of Systems  Constitutional: Negative for chills and fever.  Respiratory: Negative for shortness of breath.   Cardiovascular: Negative for chest pain.  Gastrointestinal: Positive for abdominal pain, diarrhea, nausea and vomiting. Negative for anal bleeding, blood in stool and constipation.  Genitourinary:  Negative for dysuria, scrotal swelling and testicular pain.  Neurological: Negative for syncope.  All other systems reviewed and are negative.    Physical Exam Updated Vital Signs BP 137/82 (BP Location: Left Arm)   Pulse 61   Temp 97.7 F (36.5 C) (Oral)   Resp 17   Ht 5\' 10"  (1.778 m)   SpO2 100%   BMI 18.08 kg/m   Physical Exam Vitals signs and nursing note reviewed.  Constitutional:      General: He is not in acute distress.    Appearance: He is well-developed. He is not toxic-appearing.  HENT:      Head: Normocephalic and atraumatic.  Eyes:     General:        Right eye: No discharge.        Left eye: No discharge.     Conjunctiva/sclera: Conjunctivae normal.  Neck:     Musculoskeletal: Neck supple.  Cardiovascular:     Rate and Rhythm: Normal rate and regular rhythm.  Pulmonary:     Effort: Pulmonary effort is normal. No respiratory distress.     Breath sounds: Normal breath sounds. No wheezing, rhonchi or rales.  Abdominal:     General: There is no distension.     Palpations: Abdomen is soft.     Tenderness: There is no abdominal tenderness. There is no right CVA tenderness, left CVA tenderness, guarding or rebound. Negative signs include Murphy's sign and McBurney's sign.  Skin:    General: Skin is warm and dry.     Findings: No rash.  Neurological:     Mental Status: He is alert.     Comments: Clear speech.   Psychiatric:        Behavior: Behavior normal.    ED Treatments / Results  Labs (all labs ordered are listed, but only abnormal results are displayed) Labs Reviewed  CBC - Abnormal; Notable for the following components:      Result Value   WBC 10.9 (*)    All other components within normal limits  COMPREHENSIVE METABOLIC PANEL - Abnormal; Notable for the following components:   Potassium 3.2 (*)    Glucose, Bld 172 (*)    Total Protein 8.2 (*)    All other components within normal limits  LIPASE, BLOOD  MAGNESIUM    EKG EKG Interpretation  Date/Time:  Sunday February 19 2019 09:59:12 EDT Ventricular Rate:  49 PR Interval:    QRS Duration: 90 QT Interval:  514 QTC Calculation: 464 R Axis:   96 Text Interpretation:  Sinus bradycardia Borderline right axis deviation LVH by voltage Abnormal T, consider ischemia, anterior leads Confirmed by Raeford RazorKohut, Stephen [phone] on 02/19/2019 1:09:07 PM   Radiology No results found.  Procedures Procedures (including critical care time)  Medications Ordered in ED Medications  famotidine (PEPCID) IVPB 20 mg  premix (20 mg Intravenous New Bag/Given 02/19/19 0801)  ondansetron (ZOFRAN) injection 4 mg (has no administration in time range)  sodium chloride 0.9 % bolus 1,000 mL (1,000 mLs Intravenous New Bag/Given 02/19/19 0800)     Initial Impression / Assessment and Plan / ED Course  I have reviewed the triage vital signs and the nursing notes.  Pertinent labs & imaging results that were available during my care of the patient were reviewed by me and considered in my medical decision making (see chart for details).   Patient presents to the ED w/ N/V/D & abdominal pain.  Nontoxic appearing, no apparent distress, vitals WNL.  Chart reviewed- multiple  prior ED visits for similar within the past few years, last visit 10/2018 suspected to have cannabis hyperemesis, he continues to utilize marijuana daily- most recent just PTA. Most recent CT imaging was CT renal 2018- no acute findings. Abdomen is soft, nontender, no peritoneal signs. Will obtain EKG to check QTc prior to Haldol administration as well as abdominal labs. 1L fluids ordered w/ Pepcid.   08:15: EKG: Abnormal EKG. T wave changes- no chest pain, doubt ACS in otherwise fairly healthy male.  QTc prolonged @ 525- patient has had hypokalemia on prior labs, will add on mag to labs, administer ativan & avoid QT prolongating agents @ this time. Placed on cardiac monitor.     CBC: Mild leukocytosis @ 10.9- felt to be nonspecific. No anemia.  CMP: Mild hypokalemia @ 3.2- 2 runs of IV K ordered. Hyperglycemia @ 172- PCP recheck. LFTs & renal function WNL.  Lipase: WNL Mag: WNL  11:00: RE-EVAL: Patient is sleeping on reassessment, he is difficult to awaken, given a few sips of ginger ale, we will continue to monitor.   12:30: RE-EVAL: Remains sleeping, encouraged additional fluids orally, he has taken out his IV as he did not want it anymore.    13:00: RE-EVAL: Patient remains sleeping, I was able to wake him up and have him ambulate throughout the exam  room, he is tolerating p.o, he states his grandmother can come pick him up. Repeat EKG remains abnormal, but QTc improved- he will need follow up with PCP for repeat EKG & re-assessment of his sxs. He appears appropriate for discharge. I discussed results, treatment plan, need for follow-up, and return precautions with the patient. Provided opportunity for questions, patient confirmed understanding and is in agreement with plan.   This is a shared visit with supervising physician Dr. Juleen ChinaKohut who has independently evaluated patient & provided guidance in evaluation/management/disposition, in agreement with care   Final Clinical Impressions(s) / ED Diagnoses   Final diagnoses:  Non-intractable vomiting with nausea, unspecified vomiting type  Abnormal EKG    ED Discharge Orders         Ordered    famotidine (PEPCID) 20 MG tablet  2 times daily     02/19/19 [address]1321           Teancum Brule, DriggsSamantha R, PA-C 02/19/19 1327    Raeford RazorKohut, Stephen, MD 02/20/19 1153

## 2019-02-19 NOTE — ED Triage Notes (Signed)
Pt states he has had n/v/d since midnight. Hx of gastritis. Denies taking any medication PTA. Alert and oriented.

## 2019-02-27 ENCOUNTER — Encounter: Payer: Self-pay | Admitting: Medical

## 2019-02-27 ENCOUNTER — Other Ambulatory Visit: Payer: Self-pay

## 2019-02-27 ENCOUNTER — Ambulatory Visit (INDEPENDENT_AMBULATORY_CARE_PROVIDER_SITE_OTHER): Payer: Self-pay | Admitting: Medical

## 2019-02-27 DIAGNOSIS — F12188 Cannabis abuse with other cannabis-induced disorder: Secondary | ICD-10-CM

## 2019-02-27 DIAGNOSIS — R197 Diarrhea, unspecified: Secondary | ICD-10-CM

## 2019-02-27 DIAGNOSIS — K219 Gastro-esophageal reflux disease without esophagitis: Secondary | ICD-10-CM

## 2019-02-27 NOTE — Progress Notes (Signed)
Subjective:    Patient ID: Robert Bryant, male    DOB: July 30, 1990, 28 y.o.   MRN: [id number]  Virtual Visit via Telephone Note  I connected with Robert Bryant on 02/27/19 at  3:40 PM EDT by telephone and verified that I am speaking with the correct person using two identifiers.  Location: Patient: home  Provider: office   I discussed the limitations, risks, security and privacy concerns of performing an evaluation and management service by telephone and the availability of in person appointments. I also discussed with the patient that there may be a patient responsible charge related to this service. The patient expressed understanding and agreed to proceed.   Follow Up Instructions:    I discussed the assessment and treatment plan with the patient. The patient was provided an opportunity to ask questions and all were answered. The patient agreed with the plan and demonstrated an understanding of the instructions.   The patient was advised to call back or seek an in-person evaluation if the symptoms worsen or if the condition fails to improve as anticipated.  I provided 25 minutes of non-face-to-face time during this encounter.   Esperanza Richters, PA-C   History of Present Illness:  Pt seen the other day in ED.  Notes from ED  "Text Interpretation:       Sinus bradycardia Borderline right axis deviation LVH by voltage Abnormal T, consider ischemia, anterior leads Confirmed by Raeford Razor [phone]) on 02/19/2019 1:09:07 PM   28yM with persistent n/v and abdominal pain. Abdominal exam is reassuring. EKG abnormal but denies symptoms I might potentially attribute to ACS. Mild hypokalemia supplemented. Needs outpt FU of this. Pt retreated symptomatically. He still continues to feel poorly.   The problem we subsequently ran into is that he wasn't participating in his care. When I assessed him he was curled up in a ball on his knees and wouldn't lay back despite me trying to  reassure/encourage him. He told me "I feel like shit." He otherwise wouldn't speak to me or answer my questions. I asked him several times how we could help and he just shook his head. He pulled out his IV. I do not think he is encephalopathic nor incapacitated that he cannot actually interact with me. My impression is that he just chooses not to.  His seeming unwillingness to participate in his own care limits assessment but I do not think there is an emergent issue today.    HPI Robert Bryant is a 28 y.o. male with a hx of tobacco abuse & asthma who presents to the ED w/ complaints of N/V that began @ midnight. Patient reports 10-20 episodes of non bloody non bilious emesis. Shortly after vomiting ensued he developed epigastric abdominal pain that is a 20/10 in severity, worse with movement, no other alleviating/aggravating factors. He states his last BM was just PTA and was non bloody diarrhea. No sick contacts w/ similar sxs. No recent foreign travel or abx. 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 He also consumed EtOH last night, most recently around midnight. Denies fever, chills, hematemesis, constipation, melena, hematochezia, dysuria, or testicular pain/swelling "  In ED he had + abd pain, diarrhea, nausea and vomiting. No covid test done. No troponin studies done.  Labs showed low k, mild  high wbc but pancrease protein was normal.  Pt was dc with rx of pepcid.  Pt updates me that his work would not let him return to work since he works as Theatre stage managerdish washer at [employer]. He states with healthy diet and gatorade he feels better.  Pt states his loose stools has decreased a lot. Now only about one loose stool a day. Occasional pain in abdomen on and off.  Pt states missed work since 23 rd. Last 24 hours he states  feeling close to normal. Stomach feeling better.    Pt not smoking marijuana daily now.    Observations/Objective: General-no acute distress, pleasant, oriented. Lungs- on inspection lungs appear unlabored. Neck- no tracheal deviation or jvd on inspection. Neuro- gross motor function appears intact.  Assessment and Plan: You have a history of some chronic GI issues which could represent combination of GERD as well as cannabis hyperemesis syndrome.  Strongly recommend stopping marijuana use altogether and eating healthy diet to help control reflux.  Also continue with Pepcid you feel recently from the emergency department.  I am writing a note asking urine for just use you since 23 August.  Day to return pending lab results.  I am not specifying that you take over testing but you were going to talk with your employer to see if they can get you tested somewhere quickly.  If you do get testing through your employer then I need to see copy of the results before returning you to work.  If you test through us then I will send you [facility].  Let me know if your employer will be able to test you.  If not then I will go ahead and place [facility].  You are overall improved with previous symptoms for which you were seen in the emergency department.  Now only 1 loose stool a day.   You had some low potassium and slightly elevated infection fighting cells.  Once we verify that your COVID test is negative then I will be able to have you get repeat labs through our office.  Also will see how you do with your GI symptoms.  If not improved with above measures then will refer you to gastroenterologist.  Follow-up date to be determined after we get COVID test back.  Either through your employer or through [facility].  Follow Up Instructions:    I discussed the assessment and treatment plan with the patient. The patient  was provided an opportunity to ask questions and all were answered. The patient agreed with the plan and demonstrated an understanding of the instructions.   The patient was advised to call back or seek an in-person evaluation if the symptoms worsen or if the condition fails to improve as anticipated.  I provided  25  minutes of non-face-to-face time during this encounter.  Esperanza RichtersEdward Shantoria Ellwood, PA-C     Review of Systems  Constitutional: Negative for chills, fatigue and fever.  HENT: Negative for congestion.   Respiratory: Negative for cough, chest tightness and wheezing.   Cardiovascular: Negative for chest pain and palpitations.  Gastrointestinal: Positive for diarrhea. Negative for abdominal pain.       Only one loose stool a day.   Musculoskeletal: Negative for back pain.  Skin: Positive for rash.  Hematological: Negative for adenopathy. Does not bruise/bleed easily.  Psychiatric/Behavioral: Negative for behavioral problems, confusion and sleep disturbance. The patient is not nervous/anxious.  Past Medical History:  Diagnosis Date  . Asthma      Social History   Socioeconomic History  . Marital status: Single    Spouse name: Not on file  . Number of children: Not on file  . Years of education: Not on file  . Highest education level: Not on file  Occupational History  . Not on file  Social Needs  . Financial resource strain: Not on file  . Food insecurity    Worry: Not on file    Inability: Not on file  . Transportation needs    Medical: Not on file    Non-medical: Not on file  Tobacco Use  . Smoking status: Current Every Day Smoker    Packs/day: 1.00    Types: Cigarettes  . Smokeless tobacco: Never Used  Substance and Sexual Activity  . Alcohol use: Yes    Comment: occasionally  . Drug use: Yes    Frequency: 7.0 times per week    Types: Marijuana    Comment: daily use  . Sexual activity: Yes    Birth control/protection: None  Lifestyle  . Physical  activity    Days per week: Not on file    Minutes per session: Not on file  . Stress: Not on file  Relationships  . Social Herbalist on phone: Not on file    Gets together: Not on file    Attends religious service: Not on file    Active member of club or organization: Not on file    Attends meetings of clubs or organizations: Not on file    Relationship status: Not on file  . Intimate partner violence    Fear of current or ex partner: Not on file    Emotionally abused: Not on file    Physically abused: Not on file    Forced sexual activity: Not on file  Other Topics Concern  . Not on file  Social History Narrative  . Not on file    No past surgical history on file.  Family History  Problem Relation Age of Onset  . Cancer Father     No Known Allergies  Current Outpatient Medications on File Prior to Visit  Medication Sig Dispense Refill  . famotidine (PEPCID) 20 MG tablet Take 1 tablet (20 mg total) by mouth 2 (two) times daily. 14 tablet 0  . ibuprofen (ADVIL,MOTRIN) 800 MG tablet Take 1 tablet (800 mg total) by mouth 3 (three) times daily. (Patient not taking: Reported on 02/19/2019) 21 tablet 0  . metoCLOPramide (REGLAN) 10 MG tablet Take 1 tablet (10 mg total) by mouth 3 (three) times daily as needed for nausea (headache / nausea). (Patient not taking: Reported on 02/19/2019) 20 tablet 0  . ondansetron (ZOFRAN ODT) 4 MG disintegrating tablet Take 1 tablet (4 mg total) by mouth every 8 (eight) hours as needed for nausea or vomiting. (Patient not taking: Reported on 02/19/2019) 10 tablet 0  . potassium chloride SA (K-DUR,KLOR-CON) 20 MEQ tablet Take 1 tablet (20 mEq total) by mouth daily. (Patient not taking: Reported on 02/19/2019) 4 tablet 0  . traMADol (ULTRAM) 50 MG tablet Take 1 tablet (50 mg total) by mouth every 6 (six) hours as needed. (Patient not taking: Reported on 02/19/2019) 10 tablet 0   No current facility-administered medications on file prior to visit.      There were no vitals taken for this visit.     Objective:   Physical Exam  Assessment & Plan:

## 2019-02-27 NOTE — Patient Instructions (Signed)
You have a history of some chronic GI issues which could represent combination of GERD as well as cannabis hyperemesis syndrome.  Strongly recommend stopping marijuana use altogether and eating healthy diet to help control reflux.  Also continue with Pepcid you feel recently from the emergency department.  I am writing a note asking urine for just use you since 23 August.  Day to return pending lab results.  I am not specifying that you take over testing but you were going to talk with your employer to see if they can get you tested somewhere quickly.  If you do get testing through your employer then I need to see copy of the results before returning you to work.  If you test through [place] then I will send you through the drive-through test center at [facility].  Let me know if your employer will be able to test you.  If not then I will go ahead and place the order for [facility].  You are overall improved with previous symptoms for which you were seen in the emergency department.  Now only 1 loose stool a day.   You had some low potassium and slightly elevated infection fighting cells.  Once we verify that your COVID test is negative then I will be able to have you get repeat labs through our office.  Also will see how you do with your GI symptoms.  If not improved with above measures then will refer you to gastroenterologist.  Follow-up date to be determined after we get COVID test back.  Either through your employer or through the [facility] center.

## 2022-03-28 ENCOUNTER — Emergency Department (HOSPITAL_COMMUNITY): Payer: Self-pay

## 2022-03-28 ENCOUNTER — Encounter (HOSPITAL_COMMUNITY): Payer: Self-pay | Admitting: Emergency Medicine

## 2022-03-28 ENCOUNTER — Other Ambulatory Visit: Payer: Self-pay

## 2022-03-28 ENCOUNTER — Emergency Department (HOSPITAL_COMMUNITY)
Admission: EM | Admit: 2022-03-28 | Discharge: 2022-03-29 | Disposition: A | Discharge status: Home / Self Care | Payer: Self-pay | Attending: Emergency Medicine | Admitting: Emergency Medicine

## 2022-03-28 DIAGNOSIS — Y92410 Unspecified street and highway as the place of occurrence of the external cause: Secondary | ICD-10-CM | POA: Diagnosis not present

## 2022-03-28 DIAGNOSIS — M549 Dorsalgia, unspecified: Secondary | ICD-10-CM | POA: Diagnosis not present

## 2022-03-28 DIAGNOSIS — F17210 Nicotine dependence, cigarettes, uncomplicated: Secondary | ICD-10-CM | POA: Insufficient documentation

## 2022-03-28 MED ORDER — STERILE WATER FOR INJECTION IJ SOLN
INTRAMUSCULAR | Status: AC
  Filled 2022-03-28: qty 10

## 2022-03-28 MED ORDER — HALOPERIDOL LACTATE 5 MG/ML IJ SOLN
INTRAMUSCULAR | Status: AC
  Administered 2022-03-28: 5 mg via INTRAMUSCULAR
  Filled 2022-03-28: qty 1

## 2022-03-28 MED ORDER — HALOPERIDOL LACTATE 5 MG/ML IJ SOLN: 5.0000 mg | Freq: Once | INTRAMUSCULAR | Status: AC

## 2022-03-28 MED ORDER — ZIPRASIDONE MESYLATE 20 MG IM SOLR
10.0000 mg | Freq: Once | INTRAMUSCULAR | Status: AC
  Administered 2022-03-28: 10 mg via INTRAMUSCULAR
  Filled 2022-03-28: qty 20
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
## 2022-03-28 NOTE — ED Notes (Signed)
Pt yelling profanities upon arrival and stating that he wants to leave. Security at bedside and pt cooperative and answering this RN's questions.

## 2022-03-28 NOTE — ED Notes (Signed)
[name] [phone] wants an update immediately or she'll keep calling back

## 2022-03-29 ENCOUNTER — Other Ambulatory Visit (HOSPITAL_COMMUNITY): Payer: Self-pay

## 2022-03-29 LAB — I-STAT CHEM 8, ED
BUN: 7 mg/dL (ref 6–20)
Calcium, Ion: 1.05 mmol/L — ABNORMAL LOW (ref 1.15–1.40)
Chloride: 106 mmol/L (ref 98–111)
Creatinine, Ser: 1.1 mg/dL (ref 0.61–1.24)
Glucose, Bld: 114 mg/dL — ABNORMAL HIGH (ref 70–99)
HCT: 50 % (ref 39.0–52.0)
Hemoglobin: 17 g/dL (ref 13.0–17.0)
Potassium: 3.5 mmol/L (ref 3.5–5.1)
Sodium: 144 mmol/L (ref 135–145)
TCO2: 23 mmol/L (ref 22–32)
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
## 2022-03-29 NOTE — ED Notes (Signed)
Provider made aware of elopement and she attempted to contact pt to no avail. She did contact mother who will attempt to reach pt to have return
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
## 2022-03-29 NOTE — ED Notes (Signed)
Made aware by tech that pt was not found in room nor dept. Agricultural consultant and security aware

## 2023-01-19 ENCOUNTER — Emergency Department (HOSPITAL_COMMUNITY): Payer: Self-pay

## 2023-01-19 ENCOUNTER — Observation Stay (HOSPITAL_COMMUNITY)
Admission: EM | Admit: 2023-01-19 | Discharge: 2023-01-20 | Discharge status: Against Medical Advice | Payer: Self-pay | Attending: General Surgery | Admitting: General Surgery

## 2023-01-19 ENCOUNTER — Other Ambulatory Visit: Payer: Self-pay

## 2023-01-19 DIAGNOSIS — J45909 Unspecified asthma, uncomplicated: Secondary | ICD-10-CM | POA: Insufficient documentation

## 2023-01-19 DIAGNOSIS — F17210 Nicotine dependence, cigarettes, uncomplicated: Secondary | ICD-10-CM | POA: Insufficient documentation

## 2023-01-19 DIAGNOSIS — Z79899 Other long term (current) drug therapy: Secondary | ICD-10-CM | POA: Insufficient documentation

## 2023-01-19 DIAGNOSIS — K561 Intussusception: Secondary | ICD-10-CM | POA: Diagnosis present

## 2023-01-19 DIAGNOSIS — R1013 Epigastric pain: Principal | ICD-10-CM | POA: Insufficient documentation

## 2023-01-19 LAB — COMPREHENSIVE METABOLIC PANEL
ALT: 20 U/L (ref 0–44)
AST: 34 U/L (ref 15–41)
Albumin: 4.4 g/dL (ref 3.5–5.0)
Alkaline Phosphatase: 70 U/L (ref 38–126)
Anion gap: 12 (ref 5–15)
BUN: 9 mg/dL (ref 6–20)
CO2: 24 mmol/L (ref 22–32)
Calcium: 9.1 mg/dL (ref 8.9–10.3)
Chloride: 103 mmol/L (ref 98–111)
Creatinine, Ser: 0.98 mg/dL (ref 0.61–1.24)
GFR, Estimated: >60 mL/min (ref >60)
Glucose, Bld: 155 mg/dL — ABNORMAL HIGH (ref 70–99)
Potassium: 3.4 mmol/L — ABNORMAL LOW (ref 3.5–5.1)
Sodium: 139 mmol/L (ref 135–145)
Total Bilirubin: 1 mg/dL (ref 0.3–1.2)
Total Protein: 7.8 g/dL (ref 6.5–8.1)

## 2023-01-19 LAB — URINALYSIS, ROUTINE W REFLEX MICROSCOPIC
Bilirubin Urine: NEGATIVE
Glucose, UA: 150 mg/dL — AB
Hgb urine dipstick: NEGATIVE
Ketones, ur: 80 mg/dL — AB
Leukocytes,Ua: NEGATIVE
Nitrite: NEGATIVE
Protein, ur: NEGATIVE mg/dL
Specific Gravity, Urine: 1.014 (ref 1.005–1.030)
pH: 9 — ABNORMAL HIGH (ref 5.0–8.0)

## 2023-01-19 LAB — CBC
HCT: 44.9 % (ref 39.0–52.0)
Hemoglobin: 15.1 g/dL (ref 13.0–17.0)
MCH: 32.1 pg (ref 26.0–34.0)
MCHC: 33.6 g/dL (ref 30.0–36.0)
MCV: 95.5 fL (ref 80.0–100.0)
Platelets: 262 10*3/uL (ref 150–400)
RBC: 4.7 MIL/uL (ref 4.22–5.81)
RDW: 14.1 % (ref 11.5–15.5)
WBC: 18.1 10*3/uL — ABNORMAL HIGH (ref 4.0–10.5)
nRBC: 0 % (ref 0.0–0.2)

## 2023-01-19 LAB — LIPASE, BLOOD: Lipase: 22 U/L (ref 11–51)

## 2023-01-19 LAB — ETHANOL: Alcohol, Ethyl (B): <10 mg/dL (ref <10)

## 2023-01-19 MED ORDER — LORAZEPAM 2 MG/ML IJ SOLN
0.5000 mg | Freq: Once | INTRAMUSCULAR | Status: AC
  Administered 2023-01-19: 0.5 mg via INTRAVENOUS
  Filled 2023-01-19: qty 1

## 2023-01-19 MED ORDER — SODIUM CHLORIDE 0.9 % IV BOLUS
1000.0000 mL | Freq: Once | INTRAVENOUS | Status: AC
  Administered 2023-01-19: 1000 mL via INTRAVENOUS

## 2023-01-19 MED ORDER — ONDANSETRON HCL 4 MG/2ML IJ SOLN
4.0000 mg | Freq: Four times a day (QID) | INTRAMUSCULAR | Status: DC | PRN
  Administered 2023-01-20: 4 mg via INTRAVENOUS
  Filled 2023-01-19: qty 2

## 2023-01-19 MED ORDER — DEXTROSE-SODIUM CHLORIDE 5-0.9 % IV SOLN: INTRAVENOUS | Status: DC

## 2023-01-19 MED ORDER — HYDROMORPHONE HCL 1 MG/ML IJ SOLN
1.0000 mg | Freq: Once | INTRAMUSCULAR | Status: AC
  Administered 2023-01-19: 1 mg via INTRAVENOUS
  Filled 2023-01-19: qty 1

## 2023-01-19 MED ORDER — ONDANSETRON HCL 4 MG/2ML IJ SOLN
4.0000 mg | Freq: Once | INTRAMUSCULAR | Status: AC
  Administered 2023-01-19: 4 mg via INTRAVENOUS
  Filled 2023-01-19: qty 2

## 2023-01-19 MED ORDER — ONDANSETRON 4 MG PO TBDP: 4.0000 mg | ORAL_TABLET | Freq: Four times a day (QID) | ORAL | Status: DC | PRN

## 2023-01-19 MED ORDER — HYDROMORPHONE HCL 1 MG/ML IJ SOLN
1.0000 mg | INTRAMUSCULAR | Status: DC | PRN
  Administered 2023-01-20: 1 mg via INTRAVENOUS
  Filled 2023-01-19: qty 1

## 2023-01-19 MED ORDER — IOHEXOL 300 MG/ML  SOLN
100.0000 mL | Freq: Once | INTRAMUSCULAR | Status: AC | PRN
  Administered 2023-01-19: 100 mL via INTRAVENOUS

## 2023-01-19 MED ORDER — PANTOPRAZOLE SODIUM 40 MG IV SOLR
40.0000 mg | Freq: Once | INTRAVENOUS | Status: AC
  Administered 2023-01-19: 40 mg via INTRAVENOUS
  Filled 2023-01-19: qty 10

## 2023-01-19 NOTE — ED Notes (Signed)
Pt Vitals were retaken. Complains of pain and vomiting blood. Pt also states that he feels as if he is going to have a heart attack.

## 2023-01-19 NOTE — ED Notes (Signed)
..ED TO INPATIENT HANDOFF REPORT  Name/Age/Gender Robert Bryant 32 y.o. male  Code Status    Code Status Orders  (From admission, onward)           Start     Ordered   01/19/23 2320  Full code  Continuous       Question:  By:  Answer:  Consent: discussion documented in EHR   01/19/23 2321           Code Status History     This patient has a current code status but no historical code status.       Home/SNF/Other Home  Chief Complaint Intussusception of intestine (HCC) [K56.1]  Level of Care/Admitting Diagnosis ED Disposition     ED Disposition  Admit   Condition  --   Comment  Hospital Area: [facility] [[id number]]  Level of Care: Med-Surg [16]  May place patient in observation at [facility] or [facility] if equivalent level of care is available:: No  Covid Evaluation: Asymptomatic - no recent exposure (last 10 days) testing not required  Diagnosis: Intussusception of intestine [facility]) [[id number]]  Admitting Physician: Axel Filler [phone]  Attending Physician: CCS, MD [[id number]]          Medical History Past Medical History:  Diagnosis Date   Asthma     Allergies No Known Allergies  IV Location/Drains/Wounds Patient Lines/Drains/Airways Status     Active Line/Drains/Airways     Name Placement date Placement time Site Days   Peripheral IV 03/28/22 18 G Left Antecubital 03/28/22  2048  Antecubital  297   Peripheral IV 01/19/23 20 G 1" Right Antecubital 01/19/23  1605  Antecubital  less than 1            Labs/Imaging Results for orders placed or performed during the hospital encounter of 01/19/23 (from the past 48 hour(s))  Urinalysis, Routine w reflex microscopic -Urine, Clean Catch     Status: Abnormal   Collection Time: 01/19/23  3:26 PM  Result Value Ref Range   Color, Urine YELLOW YELLOW   APPearance CLEAR CLEAR   Specific Gravity, Urine 1.014 1.005 - 1.030   pH 9.0 (H) 5.0 - 8.0   Glucose, UA 150 (A)  NEGATIVE mg/dL   Hgb urine dipstick NEGATIVE NEGATIVE   Bilirubin Urine NEGATIVE NEGATIVE   Ketones, ur 80 (A) NEGATIVE mg/dL   Protein, ur NEGATIVE NEGATIVE mg/dL   Nitrite NEGATIVE NEGATIVE   Leukocytes,Ua NEGATIVE NEGATIVE    Comment: Performed at [facility], [address], [place], [place] [address]  Lipase, blood     Status: None   Collection Time: 01/19/23  4:00 PM  Result Value Ref Range   Lipase 22 11 - 51 U/L    Comment: Performed at [facility], [address] [place], [place] [address]  Comprehensive metabolic panel     Status: Abnormal   Collection Time: 01/19/23  4:00 PM  Result Value Ref Range   Sodium 139 135 - 145 mmol/L   Potassium 3.4 (L) 3.5 - 5.1 mmol/L   Chloride 103 98 - 111 mmol/L   CO2 24 22 - 32 mmol/L   Glucose, Bld 155 (H) 70 - 99 mg/dL    Comment: Glucose reference range applies only to samples taken after fasting for at least 8 hours.   BUN 9 6 - 20 mg/dL   Creatinine, Ser 9.62 0.61 - 1.24 mg/dL   Calcium 9.1 8.9 - 95.2 mg/dL  Total Protein 7.8 6.5 - 8.1 g/dL   Albumin 4.4 3.5 - 5.0 g/dL   AST 34 15 - 41 U/L   ALT 20 0 - 44 U/L   Alkaline Phosphatase 70 38 - 126 U/L   Total Bilirubin 1.0 0.3 - 1.2 mg/dL   GFR, Estimated >82 >95 mL/min    Comment: (NOTE) Calculated using the CKD-EPI Creatinine Equation (2021)    Anion gap 12 5 - 15    Comment: Performed at [facility], [address], [place], [place] [address]  CBC     Status: Abnormal   Collection Time: 01/19/23  4:00 PM  Result Value Ref Range   WBC 18.1 (H) 4.0 - 10.5 K/uL   RBC 4.70 4.22 - 5.81 MIL/uL   Hemoglobin 15.1 13.0 - 17.0 g/dL   HCT 86.5 78.4 - 69.6 %   MCV 95.5 80.0 - 100.0 fL   MCH 32.1 26.0 - 34.0 pg   MCHC 33.6 30.0 - 36.0 g/dL   RDW 29.5 28.4 - 13.2 %   Platelets 262 150 - 400 K/uL   nRBC 0.0 0.0 - 0.2 %    Comment: Performed at [facility], [address] [place], [place] [address]   Ethanol     Status: None   Collection Time: 01/19/23  6:22 PM  Result Value Ref Range   Alcohol, Ethyl (B) <10 <10 mg/dL    Comment: (NOTE) Lowest detectable limit for serum alcohol is 10 mg/dL.  For medical purposes only. Performed at [facility], [address], [place], [place] [address]    CT ABDOMEN PELVIS W CONTRAST  Result Date: 01/19/2023 CLINICAL DATA:  Abdominal pain, acute, nonlocalized EXAM: CT ABDOMEN AND PELVIS WITH CONTRAST TECHNIQUE: Multidetector CT imaging of the abdomen and pelvis was performed using the standard protocol following bolus administration of intravenous contrast. RADIATION DOSE REDUCTION: This exam was performed according to the departmental dose-optimization program which includes automated exposure control, adjustment of the mA and/or kV according to patient size and/or use of iterative reconstruction technique. CONTRAST:  OMNIPAQUE IOHEXOL 300 MG/ML  SOLN COMPARISON:  03/29/2017 FINDINGS: Lower chest: No acute abnormality Hepatobiliary: No focal hepatic abnormality. Gallbladder unremarkable. Pancreas: No focal abnormality or ductal dilatation. Spleen: No focal abnormality.  Normal size. Adrenals/Urinary Tract: No adrenal abnormality. No suspicious focal renal abnormality. Small bilateral renal cortical cysts, the largest in the right midpole measuring 7 mm. No follow-up imaging recommended. No stones or hydronephrosis. Urinary bladder is unremarkable. Stomach/Bowel: Stomach and large bowel unremarkable. Small bowel intussusception noted in the left abdomen, approximately 4 cm in length. No associated bowel obstruction. Vascular/Lymphatic: No evidence of aneurysm or adenopathy. Reproductive: No visible focal abnormality. Other: No free fluid or free air. Musculoskeletal: No acute bony abnormality. IMPRESSION: Small bowel intussusception in the left abdomen. No associated bowel obstruction. It is unclear if this is a transient incidental  finding or may be related to the patient's pain. Otherwise no acute findings in the abdomen or pelvis. Electronically Signed   By: Charlett Nose M.D.   On: 01/19/2023 21:34    Pending Labs Unresulted Labs (From admission, onward)     Start     Ordered   01/20/23 0500  CBC  Tomorrow morning,   R        01/19/23 2321   01/20/23 0500  Basic metabolic panel  Tomorrow morning,   R        01/19/23 2321   01/19/23 2320  HIV Antibody (routine testing w rflx)  (HIV Antibody (Routine testing w reflex) panel)  Once,   R        01/19/23 2321            Vitals/Pain Today's Vitals   01/19/23 2000 01/19/23 2030 01/19/23 2100 01/19/23 2317  BP: (!) 143/91 (!) 139/103  (!) 142/97  Pulse: (!) 55   (!) 58  Resp: 18 17 19 19   Temp:    (!) 97.4 F (36.3 C)  TempSrc:    Axillary  SpO2: 100%   100%  Weight:      PainSc:        Isolation Precautions No active isolations  Medications Medications  dextrose 5 %-0.9 % sodium chloride infusion (has no administration in time range)  HYDROmorphone (DILAUDID) injection 1 mg (has no administration in time range)  ondansetron (ZOFRAN-ODT) disintegrating tablet 4 mg (has no administration in time range)    Or  ondansetron (ZOFRAN) injection 4 mg (has no administration in time range)  sodium chloride 0.9 % bolus 1,000 mL (1,000 mLs Intravenous New Bag/Given 01/19/23 1634)  pantoprazole (PROTONIX) injection 40 mg (40 mg Intravenous Given 01/19/23 1633)  HYDROmorphone (DILAUDID) injection 1 mg (1 mg Intravenous Given 01/19/23 1633)  ondansetron (ZOFRAN) injection 4 mg (4 mg Intravenous Given 01/19/23 1633)  LORazepam (ATIVAN) injection 0.5 mg (0.5 mg Intravenous Given 01/19/23 1634)  LORazepam (ATIVAN) injection 0.5 mg (0.5 mg Intravenous Given 01/19/23 1832)  ondansetron (ZOFRAN) injection 4 mg (4 mg Intravenous Given 01/19/23 1832)  iohexol (OMNIPAQUE) 300 MG/ML solution 100 mL (100 mLs Intravenous Contrast Given 01/19/23 2106)    Mobility walks with person  assist

## 2023-01-19 NOTE — ED Triage Notes (Signed)
Abdominal pain x1 day pt relates to hx of gastritis. Endorses cocaine and marijuana this morning. 16g rt AC via EMS.

## 2023-01-19 NOTE — ED Notes (Addendum)
ED TO INPATIENT HANDOFF REPORT  ED Nurse Name and Phone #:  Kizzie Bane [phone]  S Name/Age/Gender Robert Bryant 32 y.o. male Room/Bed: WA23/WA23  Code Status   Code Status: Full Code  Home/SNF/Other Home Patient oriented to: self, place, time, and situation Is this baseline? Yes   Triage Complete: Triage complete  Chief Complaint Intussusception of intestine (HCC) [K56.1]  Triage Note Abdominal pain x1 day pt relates to hx of gastritis. Endorses cocaine and marijuana this morning. 16g rt AC via EMS.   Allergies No Known Allergies  Level of Care/Admitting Diagnosis ED Disposition     ED Disposition  Admit   Condition  --   Comment  Hospital Area: [facility] [[id number]]  Level of Care: Med-Surg [16]  May place patient in observation at [facility] or [facility] if equivalent level of care is available:: No  Covid Evaluation: Asymptomatic - no recent exposure (last 10 days) testing not required  Diagnosis: Intussusception of intestine [facility]) [[id number]]  Admitting Physician: Axel Filler [id number]  Attending Physician: CCS, MD [[id number]]          B Medical/Surgery History Past Medical History:  Diagnosis Date   Asthma    No past surgical history on file.   A IV Location/Drains/Wounds Patient Lines/Drains/Airways Status     Active Line/Drains/Airways     Name Placement date Placement time Site Days   Peripheral IV 03/28/22 18 G Left Antecubital 03/28/22  2048  Antecubital  297   Peripheral IV 01/19/23 20 G 1" Right Antecubital 01/19/23  1605  Antecubital  less than 1            Intake/Output Last 24 hours No intake or output data in the 24 hours ending 01/19/23 2353  Labs/Imaging Results for orders placed or performed during the hospital encounter of 01/19/23 (from the past 48 hour(s))  Urinalysis, Routine w reflex microscopic -Urine, Clean Catch     Status: Abnormal   Collection Time: 01/19/23  3:26 PM  Result Value  Ref Range   Color, Urine YELLOW YELLOW   APPearance CLEAR CLEAR   Specific Gravity, Urine 1.014 1.005 - 1.030   pH 9.0 (H) 5.0 - 8.0   Glucose, UA 150 (A) NEGATIVE mg/dL   Hgb urine dipstick NEGATIVE NEGATIVE   Bilirubin Urine NEGATIVE NEGATIVE   Ketones, ur 80 (A) NEGATIVE mg/dL   Protein, ur NEGATIVE NEGATIVE mg/dL   Nitrite NEGATIVE NEGATIVE   Leukocytes,Ua NEGATIVE NEGATIVE    Comment: Performed at [facility], [address], [place], [place] [address]  Lipase, blood     Status: None   Collection Time: 01/19/23  4:00 PM  Result Value Ref Range   Lipase 22 11 - 51 U/L    Comment: Performed at [facility], [address] [place], [place] [address]  Comprehensive metabolic panel     Status: Abnormal   Collection Time: 01/19/23  4:00 PM  Result Value Ref Range   Sodium 139 135 - 145 mmol/L   Potassium 3.4 (L) 3.5 - 5.1 mmol/L   Chloride 103 98 - 111 mmol/L   CO2 24 22 - 32 mmol/L   Glucose, Bld 155 (H) 70 - 99 mg/dL    Comment: Glucose reference range applies only to samples taken after fasting for at least 8 hours.   BUN 9 6 - 20 mg/dL   Creatinine, Ser 2.72 0.61 - 1.24 mg/dL   Calcium 9.1 8.9 - 53.6 mg/dL  Total Protein 7.8 6.5 - 8.1 g/dL   Albumin 4.4 3.5 - 5.0 g/dL   AST 34 15 - 41 U/L   ALT 20 0 - 44 U/L   Alkaline Phosphatase 70 38 - 126 U/L   Total Bilirubin 1.0 0.3 - 1.2 mg/dL   GFR, Estimated >56 >21 mL/min    Comment: (NOTE) Calculated using the CKD-EPI Creatinine Equation (2021)    Anion gap 12 5 - 15    Comment: Performed at [facility], [address], [place], [place] [address]  CBC     Status: Abnormal   Collection Time: 01/19/23  4:00 PM  Result Value Ref Range   WBC 18.1 (H) 4.0 - 10.5 K/uL   RBC 4.70 4.22 - 5.81 MIL/uL   Hemoglobin 15.1 13.0 - 17.0 g/dL   HCT 78.4 69.6 - 29.5 %   MCV 95.5 80.0 - 100.0 fL   MCH 32.1 26.0 - 34.0 pg   MCHC 33.6 30.0 - 36.0 g/dL   RDW 28.4 13.2 - 44.0 %    Platelets 262 150 - 400 K/uL   nRBC 0.0 0.0 - 0.2 %    Comment: Performed at [facility], [address] [place], [place] [address]  Ethanol     Status: None   Collection Time: 01/19/23  6:22 PM  Result Value Ref Range   Alcohol, Ethyl (B) <10 <10 mg/dL    Comment: (NOTE) Lowest detectable limit for serum alcohol is 10 mg/dL.  For medical purposes only. Performed at [facility], [address] [place], [place] [address]    CT ABDOMEN PELVIS W CONTRAST  Result Date: 01/19/2023 CLINICAL DATA:  Abdominal pain, acute, nonlocalized EXAM: CT ABDOMEN AND PELVIS WITH CONTRAST TECHNIQUE: Multidetector CT imaging of the abdomen and pelvis was performed using the standard protocol following bolus administration of intravenous contrast. RADIATION DOSE REDUCTION: This exam was performed according to the departmental dose-optimization program which includes automated exposure control, adjustment of the mA and/or kV according to patient size and/or use of iterative reconstruction technique. CONTRAST:  OMNIPAQUE IOHEXOL 300 MG/ML  SOLN COMPARISON:  03/29/2017 FINDINGS: Lower chest: No acute abnormality Hepatobiliary: No focal hepatic abnormality. Gallbladder unremarkable. Pancreas: No focal abnormality or ductal dilatation. Spleen: No focal abnormality.  Normal size. Adrenals/Urinary Tract: No adrenal abnormality. No suspicious focal renal abnormality. Small bilateral renal cortical cysts, the largest in the right midpole measuring 7 mm. No follow-up imaging recommended. No stones or hydronephrosis. Urinary bladder is unremarkable. Stomach/Bowel: Stomach and large bowel unremarkable. Small bowel intussusception noted in the left abdomen, approximately 4 cm in length. No associated bowel obstruction. Vascular/Lymphatic: No evidence of aneurysm or adenopathy. Reproductive: No visible focal abnormality. Other: No free fluid or free air. Musculoskeletal: No acute bony  abnormality. IMPRESSION: Small bowel intussusception in the left abdomen. No associated bowel obstruction. It is unclear if this is a transient incidental finding or may be related to the patient's pain. Otherwise no acute findings in the abdomen or pelvis. Electronically Signed   By: Charlett Nose M.D.   On: 01/19/2023 21:34    Pending Labs Unresulted Labs (From admission, onward)     Start     Ordered   01/20/23 0500  CBC  Tomorrow morning,   R        01/19/23 2321   01/20/23 0500  Basic metabolic panel  Tomorrow morning,   R        01/19/23 2321   01/19/23 2320  HIV Antibody (routine testing w rflx)  (HIV Antibody (Routine testing w reflex) panel)  Once,   R        01/19/23 2321            Vitals/Pain Today's Vitals   01/19/23 2000 01/19/23 2030 01/19/23 2100 01/19/23 2317  BP: (!) 143/91 (!) 139/103  (!) 142/97  Pulse: (!) 55   (!) 58  Resp: 18 17 19 19   Temp:    (!) 97.4 F (36.3 C)  TempSrc:    Axillary  SpO2: 100%   100%  Weight:      PainSc:        Isolation Precautions No active isolations  Medications Medications  dextrose 5 %-0.9 % sodium chloride infusion (has no administration in time range)  HYDROmorphone (DILAUDID) injection 1 mg (has no administration in time range)  ondansetron (ZOFRAN-ODT) disintegrating tablet 4 mg (has no administration in time range)    Or  ondansetron (ZOFRAN) injection 4 mg (has no administration in time range)  sodium chloride 0.9 % bolus 1,000 mL (1,000 mLs Intravenous New Bag/Given 01/19/23 1634)  pantoprazole (PROTONIX) injection 40 mg (40 mg Intravenous Given 01/19/23 1633)  HYDROmorphone (DILAUDID) injection 1 mg (1 mg Intravenous Given 01/19/23 1633)  ondansetron (ZOFRAN) injection 4 mg (4 mg Intravenous Given 01/19/23 1633)  LORazepam (ATIVAN) injection 0.5 mg (0.5 mg Intravenous Given 01/19/23 1634)  LORazepam (ATIVAN) injection 0.5 mg (0.5 mg Intravenous Given 01/19/23 1832)  ondansetron (ZOFRAN) injection 4 mg (4 mg  Intravenous Given 01/19/23 1832)  iohexol (OMNIPAQUE) 300 MG/ML solution 100 mL (100 mLs Intravenous Contrast Given 01/19/23 2106)    Mobility walks with person assist     Focused Assessments     R Recommendations: See Admitting Provider Note  Report given to:   Additional Notes:

## 2023-01-19 NOTE — H&P (Signed)
Robert Bryant is an 32 y.o. male.   Chief Complaint: Abdominal pain HPI: Patient is a 32 year old male who comes in with a 24-hour history of abdominal pain.  Patient states that he had abdominal pain beginning today.  He states that he had associated nausea vomiting.  He states it mainly is left-sided abdominal pain.  He tells me he has gastroenteritis.  Upon evaluation in the ER he underwent CT scan and laboratory studies.  CT scan was significant for a left-sided small bowel intussusception.  There is no signs of SBO.  Patient with a leukocytosis.  Patient denies any abdominal surgery.  Past Medical History:  Diagnosis Date   Asthma     No past surgical history on file.  Family History  Problem Relation Age of Onset   Cancer Father    Social History:  reports that he has been smoking cigarettes. He has never used smokeless tobacco. He reports current alcohol use. He reports current drug use. Frequency: 7.00 times per week. Drug: Marijuana.  Allergies: No Known Allergies  (Not in a hospital admission)   Results for orders placed or performed during the hospital encounter of 01/19/23 (from the past 48 hour(s))  Urinalysis, Routine w reflex microscopic -Urine, Clean Catch     Status: Abnormal   Collection Time: 01/19/23  3:26 PM  Result Value Ref Range   Color, Urine YELLOW YELLOW   APPearance CLEAR CLEAR   Specific Gravity, Urine 1.014 1.005 - 1.030   pH 9.0 (H) 5.0 - 8.0   Glucose, UA 150 (A) NEGATIVE mg/dL   Hgb urine dipstick NEGATIVE NEGATIVE   Bilirubin Urine NEGATIVE NEGATIVE   Ketones, ur 80 (A) NEGATIVE mg/dL   Protein, ur NEGATIVE NEGATIVE mg/dL   Nitrite NEGATIVE NEGATIVE   Leukocytes,Ua NEGATIVE NEGATIVE    Comment: Performed at [facility], [address], [place], [place] [address]  Lipase, blood     Status: None   Collection Time: 01/19/23  4:00 PM  Result Value Ref Range   Lipase 22 11 - 51 U/L    Comment: Performed at [facility], [address], [place], [place] [address]  Comprehensive metabolic panel     Status: Abnormal   Collection Time: 01/19/23  4:00 PM  Result Value Ref Range   Sodium 139 135 - 145 mmol/L   Potassium 3.4 (L) 3.5 - 5.1 mmol/L   Chloride 103 98 - 111 mmol/L   CO2 24 22 - 32 mmol/L   Glucose, Bld 155 (H) 70 - 99 mg/dL    Comment: Glucose reference range applies only to samples taken after fasting for at least 8 hours.   BUN 9 6 - 20 mg/dL   Creatinine, Ser 5.64 0.61 - 1.24 mg/dL   Calcium 9.1 8.9 - 33.2 mg/dL   Total Protein 7.8 6.5 - 8.1 g/dL   Albumin 4.4 3.5 - 5.0 g/dL   AST 34 15 - 41 U/L   ALT 20 0 - 44 U/L   Alkaline Phosphatase 70 38 - 126 U/L   Total Bilirubin 1.0 0.3 - 1.2 mg/dL   GFR, Estimated >95 >18 mL/min    Comment: (NOTE) Calculated using the CKD-EPI Creatinine Equation (2021)    Anion gap 12 5 - 15    Comment: Performed at [facility], [address] [place], [place] [address]  CBC     Status: Abnormal   Collection Time: 01/19/23  4:00 PM  Result Value Ref Range  WBC 18.1 (H) 4.0 - 10.5 K/uL   RBC 4.70 4.22 - 5.81 MIL/uL   Hemoglobin 15.1 13.0 - 17.0 g/dL   HCT 16.1 09.6 - 04.5 %   MCV 95.5 80.0 - 100.0 fL   MCH 32.1 26.0 - 34.0 pg   MCHC 33.6 30.0 - 36.0 g/dL   RDW 40.9 81.1 - 91.4 %   Platelets 262 150 - 400 K/uL   nRBC 0.0 0.0 - 0.2 %    Comment: Performed at [facility], [address] [place], [place] [address]  Ethanol     Status: None   Collection Time: 01/19/23  6:22 PM  Result Value Ref Range   Alcohol, Ethyl (B) <10 <10 mg/dL    Comment: (NOTE) Lowest detectable limit for serum alcohol is 10 mg/dL.  For medical purposes only. Performed at [facility], [address], [place], [place] [address]    CT ABDOMEN PELVIS W CONTRAST  Result Date: 01/19/2023 CLINICAL DATA:  Abdominal pain, acute, nonlocalized EXAM: CT ABDOMEN AND PELVIS WITH CONTRAST TECHNIQUE:  Multidetector CT imaging of the abdomen and pelvis was performed using the standard protocol following bolus administration of intravenous contrast. RADIATION DOSE REDUCTION: This exam was performed according to the departmental dose-optimization program which includes automated exposure control, adjustment of the mA and/or kV according to patient size and/or use of iterative reconstruction technique. CONTRAST:  OMNIPAQUE IOHEXOL 300 MG/ML  SOLN COMPARISON:  03/29/2017 FINDINGS: Lower chest: No acute abnormality Hepatobiliary: No focal hepatic abnormality. Gallbladder unremarkable. Pancreas: No focal abnormality or ductal dilatation. Spleen: No focal abnormality.  Normal size. Adrenals/Urinary Tract: No adrenal abnormality. No suspicious focal renal abnormality. Small bilateral renal cortical cysts, the largest in the right midpole measuring 7 mm. No follow-up imaging recommended. No stones or hydronephrosis. Urinary bladder is unremarkable. Stomach/Bowel: Stomach and large bowel unremarkable. Small bowel intussusception noted in the left abdomen, approximately 4 cm in length. No associated bowel obstruction. Vascular/Lymphatic: No evidence of aneurysm or adenopathy. Reproductive: No visible focal abnormality. Other: No free fluid or free air. Musculoskeletal: No acute bony abnormality. IMPRESSION: Small bowel intussusception in the left abdomen. No associated bowel obstruction. It is unclear if this is a transient incidental finding or may be related to the patient's pain. Otherwise no acute findings in the abdomen or pelvis. Electronically Signed   By: Charlett Nose M.D.   On: 01/19/2023 21:34    Review of Systems  Constitutional:  Negative for chills and fever.  HENT:  Negative for ear discharge, hearing loss and sore throat.   Eyes:  Negative for discharge.  Respiratory:  Negative for cough and shortness of breath.   Cardiovascular:  Negative for chest pain and leg swelling.  Gastrointestinal:   Positive for abdominal pain, nausea and vomiting. Negative for constipation.  Musculoskeletal:  Negative for myalgias and neck pain.  Skin:  Negative for rash.  Allergic/Immunologic: Negative for environmental allergies.  Neurological:  Negative for dizziness and seizures.  Hematological:  Does not bruise/bleed easily.  Psychiatric/Behavioral:  Negative for suicidal ideas.   All other systems reviewed and are negative.   Blood pressure (!) 142/97, pulse (!) 58, temperature (!) 97.4 F (36.3 C), temperature source Axillary, resp. rate 19, weight 68 kg, SpO2 100%. Physical Exam Constitutional:      Appearance: He is well-developed.     Comments: Conversant No acute distress  HENT:     Head: Normocephalic and atraumatic.  Eyes:     General: Lids are  normal. No scleral icterus.    Pupils: Pupils are equal, round, and reactive to light.     Comments: Pupils are equal round and reactive No lid lag Moist conjunctiva  Neck:     Thyroid: No thyromegaly.     Trachea: No tracheal tenderness.     Comments: No cervical lymphadenopathy Cardiovascular:     Rate and Rhythm: Normal rate and regular rhythm.     Heart sounds: No murmur heard. Pulmonary:     Effort: Pulmonary effort is normal.     Breath sounds: Normal breath sounds. No wheezing or rales.  Abdominal:     Tenderness: There is abdominal tenderness in the left upper quadrant.     Hernia: No hernia is present.  Musculoskeletal:     Cervical back: Normal range of motion and neck supple.  Skin:    General: Skin is warm.     Findings: No rash.     Nails: There is no clubbing.     Comments: Normal skin turgor  Neurological:     Mental Status: He is alert and oriented to person, place, and time.     Comments: Normal gait and station  Psychiatric:        Mood and Affect: Mood normal.        Thought Content: Thought content normal.        Judgment: Judgment normal.     Comments: Appropriate affect       Assessment/Plan 32 year old male with nausea vomiting, intussusception of the small bowel. Possible gastroenteritis  1.  Will admit patient for observation, n.p.o., IV fluids. 2.  I discussed with the patient that if pain does not resolve and continued with nausea vomiting may likely require diagnostic laparoscopy tomorrow by Dr. Donell Beers. 3.  Will continue with serial abdominal exams. 4.  Pain control, antinausea medication  Axel Filler, MD 01/19/2023, 11:38 PM

## 2023-01-20 ENCOUNTER — Encounter (HOSPITAL_COMMUNITY): Payer: Self-pay | Admitting: General Surgery

## 2023-01-20 LAB — BASIC METABOLIC PANEL
Anion gap: 12 (ref 5–15)
BUN: 8 mg/dL (ref 6–20)
CO2: 26 mmol/L (ref 22–32)
Calcium: 9 mg/dL (ref 8.9–10.3)
Chloride: 103 mmol/L (ref 98–111)
Creatinine, Ser: 1.01 mg/dL (ref 0.61–1.24)
GFR, Estimated: >60 mL/min (ref >60)
Glucose, Bld: 112 mg/dL — ABNORMAL HIGH (ref 70–99)
Potassium: 3.5 mmol/L (ref 3.5–5.1)
Sodium: 141 mmol/L (ref 135–145)

## 2023-01-20 LAB — CBC
HCT: 46.1 % (ref 39.0–52.0)
Hemoglobin: 15.6 g/dL (ref 13.0–17.0)
MCH: 32.4 pg (ref 26.0–34.0)
MCHC: 33.8 g/dL (ref 30.0–36.0)
MCV: 95.8 fL (ref 80.0–100.0)
Platelets: 239 10*3/uL (ref 150–400)
RBC: 4.81 MIL/uL (ref 4.22–5.81)
RDW: 14.5 % (ref 11.5–15.5)
WBC: 14.8 10*3/uL — ABNORMAL HIGH (ref 4.0–10.5)
nRBC: 0 % (ref 0.0–0.2)

## 2023-01-20 LAB — HIV ANTIBODY (ROUTINE TESTING W REFLEX): HIV Screen 4th Generation wRfx: NONREACTIVE

## 2023-01-20 NOTE — Progress Notes (Signed)
Arrived to room to find pt fully covered with blanket. Spoke to pt and addressed him by name. Introduced self to pt and stated purpose. Pt asked what he needed to do. Explained to pt that his arms would need to be out from under the blanket and asked if he could lie on his back for better assessment. Pt did not move and would not answer when addressed by name. Asked pt again if he could take his arms from under the blanket. Again pt would not do this and would not answer when addressed by name. Asked pt if he would like for this RN to place his IV. Again pt did not answer. Unit RN notified of no cooperation from pt. Advised to place another consult if pt becomes agreeable to PIV and is more cooperative.

## 2023-01-20 NOTE — Plan of Care (Signed)

## 2023-01-20 NOTE — ED Provider Notes (Signed)
[facility] Provider Note   CSN: [id number] Arrival date & time: 01/19/23  1300     History  Chief Complaint  Patient presents with   Abdominal Pain    Robert Bryant is a 32 y.o. male.  Patient complains of abdominal pain and vomiting today.  Patient does have a history of mild alcohol use and he states he has been throwing up from blood  The history is provided by the patient and medical records.  Abdominal Pain Pain location:  Epigastric Pain quality: aching   Pain radiates to:  Does not radiate Pain severity:  Moderate Onset quality:  Sudden Timing:  Constant Progression:  Worsening Chronicity:  New Context: alcohol use   Relieved by:  Nothing Worsened by:  Nothing Associated symptoms: no chest pain, no cough, no diarrhea, no fatigue and no hematuria        Home Medications Prior to Admission medications   Medication Sig Start Date End Date Taking? Authorizing Provider  famotidine (PEPCID) 20 MG tablet Take 1 tablet (20 mg total) by mouth 2 (two) times daily. Patient not taking: Reported on 01/20/2023 02/19/19   Petrucelli, Pleas Koch, PA-C  ibuprofen (ADVIL,MOTRIN) 800 MG tablet Take 1 tablet (800 mg total) by mouth 3 (three) times daily. Patient not taking: Reported on 02/19/2019 05/02/18   Ward, Chase Picket, PA-C  metoCLOPramide (REGLAN) 10 MG tablet Take 1 tablet (10 mg total) by mouth 3 (three) times daily as needed for nausea (headache / nausea). Patient not taking: Reported on 02/19/2019 10/21/15   Danelle Berry, PA-C  ondansetron (ZOFRAN ODT) 4 MG disintegrating tablet Take 1 tablet (4 mg total) by mouth every 8 (eight) hours as needed for nausea or vomiting. Patient not taking: Reported on 02/19/2019 03/29/17   Liberty Handy, PA-C  potassium chloride SA (K-DUR,KLOR-CON) 20 MEQ tablet Take 1 tablet (20 mEq total) by mouth daily. Patient not taking: Reported on 02/19/2019 05/02/18   Ward, Chase Picket, PA-C  traMADol (ULTRAM) 50 MG  tablet Take 1 tablet (50 mg total) by mouth every 6 (six) hours as needed. Patient not taking: Reported on 02/19/2019 04/24/18   Raeford Razor, MD      Allergies    Patient has no known allergies.    Review of Systems   Review of Systems  Constitutional:  Negative for appetite change and fatigue.  HENT:  Negative for congestion, ear discharge and sinus pressure.   Eyes:  Negative for discharge.  Respiratory:  Negative for cough.   Cardiovascular:  Negative for chest pain.  Gastrointestinal:  Positive for abdominal pain. Negative for diarrhea.  Genitourinary:  Negative for frequency and hematuria.  Musculoskeletal:  Negative for back pain.  Skin:  Negative for rash.  Neurological:  Negative for seizures and headaches.  Psychiatric/Behavioral:  Negative for hallucinations.     Physical Exam Updated Vital Signs BP 116/62 (BP Location: Left Arm)   Pulse 67   Temp 98.4 F (36.9 C) (Oral)   Resp 20   Ht 5\' 10"  (1.778 m)   Wt 68 kg   SpO2 100%   BMI 21.51 kg/m  Physical Exam Vitals and nursing note reviewed.  Constitutional:      Appearance: He is well-developed.  HENT:     Head: Normocephalic.     Mouth/Throat:     Mouth: Mucous membranes are moist.  Eyes:     General: No scleral icterus.    Conjunctiva/sclera: Conjunctivae normal.  Neck:     Thyroid: No  thyromegaly.  Cardiovascular:     Rate and Rhythm: Normal rate and regular rhythm.     Heart sounds: No murmur heard.    No friction rub. No gallop.  Pulmonary:     Breath sounds: No stridor. No wheezing or rales.  Chest:     Chest wall: No tenderness.  Abdominal:     General: There is no distension.     Tenderness: There is no abdominal tenderness. There is no rebound.  Musculoskeletal:        General: Normal range of motion.     Cervical back: Neck supple.  Lymphadenopathy:     Cervical: No cervical adenopathy.  Skin:    Findings: No erythema or rash.  Neurological:     Mental Status: He is alert and  oriented to person, place, and time.     Motor: No abnormal muscle tone.     Coordination: Coordination normal.  Psychiatric:        Behavior: Behavior normal.     ED Results / Procedures / Treatments   Labs (all labs ordered are listed, but only abnormal results are displayed) Labs Reviewed  COMPREHENSIVE METABOLIC PANEL - Abnormal; Notable for the following components:      Result Value   Potassium 3.4 (*)    Glucose, Bld 155 (*)    All other components within normal limits  CBC - Abnormal; Notable for the following components:   WBC 18.1 (*)    All other components within normal limits  URINALYSIS, ROUTINE W REFLEX MICROSCOPIC - Abnormal; Notable for the following components:   pH 9.0 (*)    Glucose, UA 150 (*)    Ketones, ur 80 (*)    All other components within normal limits  CBC - Abnormal; Notable for the following components:   WBC 14.8 (*)    All other components within normal limits  BASIC METABOLIC PANEL - Abnormal; Notable for the following components:   Glucose, Bld 112 (*)    All other components within normal limits  LIPASE, BLOOD  ETHANOL  HIV ANTIBODY (ROUTINE TESTING W REFLEX)    EKG None  Radiology CT ABDOMEN PELVIS W CONTRAST  Result Date: 01/19/2023 CLINICAL DATA:  Abdominal pain, acute, nonlocalized EXAM: CT ABDOMEN AND PELVIS WITH CONTRAST TECHNIQUE: Multidetector CT imaging of the abdomen and pelvis was performed using the standard protocol following bolus administration of intravenous contrast. RADIATION DOSE REDUCTION: This exam was performed according to the departmental dose-optimization program which includes automated exposure control, adjustment of the mA and/or kV according to patient size and/or use of iterative reconstruction technique. CONTRAST:  OMNIPAQUE IOHEXOL 300 MG/ML  SOLN COMPARISON:  03/29/2017 FINDINGS: Lower chest: No acute abnormality Hepatobiliary: No focal hepatic abnormality. Gallbladder unremarkable. Pancreas: No focal  abnormality or ductal dilatation. Spleen: No focal abnormality.  Normal size. Adrenals/Urinary Tract: No adrenal abnormality. No suspicious focal renal abnormality. Small bilateral renal cortical cysts, the largest in the right midpole measuring 7 mm. No follow-up imaging recommended. No stones or hydronephrosis. Urinary bladder is unremarkable. Stomach/Bowel: Stomach and large bowel unremarkable. Small bowel intussusception noted in the left abdomen, approximately 4 cm in length. No associated bowel obstruction. Vascular/Lymphatic: No evidence of aneurysm or adenopathy. Reproductive: No visible focal abnormality. Other: No free fluid or free air. Musculoskeletal: No acute bony abnormality. IMPRESSION: Small bowel intussusception in the left abdomen. No associated bowel obstruction. It is unclear if this is a transient incidental finding or may be related to the patient's pain. Otherwise no  acute findings in the abdomen or pelvis. Electronically Signed   By: Charlett Nose M.D.   On: 01/19/2023 21:34    Procedures Procedures    Medications Ordered in ED Medications  dextrose 5 %-0.9 % sodium chloride infusion (0 mLs Intravenous Stopped 01/20/23 1200)  HYDROmorphone (DILAUDID) injection 1 mg (1 mg Intravenous Given 01/20/23 0500)  ondansetron (ZOFRAN-ODT) disintegrating tablet 4 mg ( Oral See Alternative 01/20/23 0459)    Or  ondansetron (ZOFRAN) injection 4 mg (4 mg Intravenous Given 01/20/23 0459)  sodium chloride 0.9 % bolus 1,000 mL (1,000 mLs Intravenous New Bag/Given 01/19/23 1634)  pantoprazole (PROTONIX) injection 40 mg (40 mg Intravenous Given 01/19/23 1633)  HYDROmorphone (DILAUDID) injection 1 mg (1 mg Intravenous Given 01/19/23 1633)  ondansetron (ZOFRAN) injection 4 mg (4 mg Intravenous Given 01/19/23 1633)  LORazepam (ATIVAN) injection 0.5 mg (0.5 mg Intravenous Given 01/19/23 1634)  LORazepam (ATIVAN) injection 0.5 mg (0.5 mg Intravenous Given 01/19/23 1832)  ondansetron (ZOFRAN) injection 4 mg  (4 mg Intravenous Given 01/19/23 1832)  iohexol (OMNIPAQUE) 300 MG/ML solution 100 mL (100 mLs Intravenous Contrast Given 01/19/23 2106)    ED Course/ Medical Decision Making/ A&P                             Medical Decision Making Amount and/or Complexity of Data Reviewed Labs: ordered. Radiology: ordered.  Risk Prescription drug management. Decision regarding hospitalization.   Patient with abdominal pain and intussusception of the small bowel seen on CT scan.  I spoke with Dr. Derrell Lolling and he will admit the patient and make him n.p.o.  He will be treated for pain and nausea and given fluids and possible of surgery the next day        Final Clinical Impression(s) / ED Diagnoses Final diagnoses:  None    Rx / DC Orders ED Discharge Orders     None         Bethann Berkshire, MD 01/20/23 1317

## 2023-01-20 NOTE — Plan of Care (Signed)
°  Problem: Coping: °Goal: Level of anxiety will decrease °Outcome: Progressing °  °

## 2023-01-20 NOTE — Progress Notes (Signed)
Pt called this RN to room. Pt was sitting at edge of bed. Pt states he hadn't eat or drink anything since yesterday. He is very hungry. Pt was explained about the need of NPO status but pt kept asking something to eat. Pt was given some ice chips. Later pt used the same cup and drink water from sink.  [facility] on call  "PA Tresa Endo" was made aware about it. She said they were coming to see pt this afternoon and update order.  Pt called this RN again and said "he is not staying until the doctor come see him. He is going home right now. [facility] PA kelly was notified. AMA form was signed. Iv taken out. Pt walked out of the hospital.
